# Patient Record
Sex: Female | Born: 1965 | Race: Black or African American | Hispanic: No | Marital: Married | Smoking: Never smoker
Health system: Southern US, Community
[De-identification: ages and names within clinical notes are randomized; demographics above are authoritative.]

## PROBLEM LIST (undated history)

## (undated) DIAGNOSIS — E559 Vitamin D deficiency, unspecified: Secondary | ICD-10-CM

## (undated) HISTORY — PX: OTHER SURGICAL HISTORY: SHX169

---
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
## 2013-01-14 ENCOUNTER — Encounter (HOSPITAL_COMMUNITY): Payer: Self-pay | Admitting: Family Medicine

## 2013-01-14 ENCOUNTER — Emergency Department (HOSPITAL_COMMUNITY): Payer: No Typology Code available for payment source

## 2013-01-14 ENCOUNTER — Emergency Department (HOSPITAL_COMMUNITY)
Admission: EM | Admit: 2013-01-14 | Discharge: 2013-01-14 | Disposition: A | Discharge status: Home / Self Care | Payer: No Typology Code available for payment source | Attending: Family Medicine | Admitting: Family Medicine

## 2013-01-14 DIAGNOSIS — R531 Weakness: Secondary | ICD-10-CM

## 2013-01-14 DIAGNOSIS — R202 Paresthesia of skin: Secondary | ICD-10-CM

## 2013-01-14 DIAGNOSIS — R209 Unspecified disturbances of skin sensation: Secondary | ICD-10-CM | POA: Insufficient documentation

## 2013-01-14 DIAGNOSIS — M6281 Muscle weakness (generalized): Secondary | ICD-10-CM | POA: Insufficient documentation

## 2013-01-14 DIAGNOSIS — R51 Headache: Secondary | ICD-10-CM | POA: Insufficient documentation

## 2013-01-14 DIAGNOSIS — R29898 Other symptoms and signs involving the musculoskeletal system: Secondary | ICD-10-CM

## 2013-01-14 LAB — URINALYSIS, ROUTINE W REFLEX MICROSCOPIC
Bilirubin Urine: NEGATIVE
Glucose, UA: NEGATIVE mg/dL
Protein, ur: NEGATIVE mg/dL
Specific Gravity, Urine: 1.004 — ABNORMAL LOW (ref 1.005–1.030)
Urobilinogen, UA: 0.2 mg/dL (ref 0.0–1.0)

## 2013-01-14 LAB — POCT I-STAT, CHEM 8
Calcium, Ion: 1.17 mmol/L (ref 1.12–1.23)
Chloride: 108 mEq/L (ref 96–112)
Glucose, Bld: 100 mg/dL — ABNORMAL HIGH (ref 70–99)
HCT: 37 % (ref 36.0–46.0)
Hemoglobin: 12.6 g/dL (ref 12.0–15.0)
Sodium: 139 mEq/L (ref 135–145)
TCO2: 20 mmol/L (ref 0–100)

## 2013-01-14 LAB — CBC
MCH: 26.2 pg (ref 26.0–34.0)
Platelets: 316 10*3/uL (ref 150–400)
RDW: 14.6 % (ref 11.5–15.5)
WBC: 10.9 10*3/uL — ABNORMAL HIGH (ref 4.0–10.5)

## 2013-01-14 LAB — COMPREHENSIVE METABOLIC PANEL
ALT: 17 U/L (ref 0–35)
AST: 22 U/L (ref 0–37)
Albumin: 3.9 g/dL (ref 3.5–5.2)
CO2: 20 mEq/L (ref 19–32)
Calcium: 9.4 mg/dL (ref 8.4–10.5)
Chloride: 103 mEq/L (ref 96–112)
GFR calc Af Amer: >90 mL/min (ref >90)
GFR calc non Af Amer: >90 mL/min (ref >90)
Sodium: 136 mEq/L (ref 135–145)
Total Bilirubin: 0.3 mg/dL (ref 0.3–1.2)

## 2013-01-14 LAB — DIFFERENTIAL
Basophils Absolute: 0 10*3/uL (ref 0.0–0.1)
Basophils Relative: 0 % (ref 0–1)
Lymphocytes Relative: 16 % (ref 12–46)
Neutro Abs: 8.2 10*3/uL — ABNORMAL HIGH (ref 1.7–7.7)

## 2013-01-14 LAB — POCT I-STAT TROPONIN I: Troponin i, poc: 0 ng/mL (ref 0.00–0.08)

## 2013-01-14 LAB — RAPID URINE DRUG SCREEN, HOSP PERFORMED
Amphetamines: NOT DETECTED
Barbiturates: NOT DETECTED
Cocaine: NOT DETECTED
Tetrahydrocannabinol: NOT DETECTED

## 2013-01-14 LAB — URINE MICROSCOPIC-ADD ON

## 2013-01-14 LAB — PROTIME-INR
INR: 1.09 (ref 0.00–1.49)
Prothrombin Time: 13.9 seconds (ref 11.6–15.2)

## 2013-01-14 LAB — ETHANOL: Alcohol, Ethyl (B): <11 mg/dL (ref 0–11)

## 2013-01-14 LAB — APTT: aPTT: 34 seconds (ref 24–37)

## 2013-01-14 LAB — GLUCOSE, CAPILLARY: Glucose-Capillary: 98 mg/dL (ref 70–99)

## 2013-01-14 MED ORDER — ASPIRIN EC 81 MG PO TBEC: 81.0000 mg | DELAYED_RELEASE_TABLET | Freq: Every day | ORAL | Status: DC

## 2013-01-14 MED ORDER — IOHEXOL 350 MG/ML SOLN
50.0000 mL | Freq: Once | INTRAVENOUS | Status: AC | PRN
  Administered 2013-01-14: 50 mL via INTRAVENOUS

## 2013-01-14 NOTE — ED Provider Notes (Signed)
I saw and evaluated the patient, reviewed the resident's note and I agree with the findings and plan.  Pt is a 47 y.o. with no significant past medical history who reports that at 70 AM she became very upset and then again having right-sided facial numbness and weakness. She reports she is also having a mild right-sided headache. She's never had similar symptoms in the past. No history of head injury. No fever, neck pain or neck stiffness. Patient reports that her symptoms are early and slightly improving. On exam, cranial nerves II through XII intact, no pronator drift in her upper extremities, patient is able to angulate without difficulty, moves all extremities equally, sensation to light touch slightly diminished in her right upper extremity compared to left.  Code stroke initiated.  Neurology has seen patient. Head CT shows no acute abnormality.  Given her symptoms are improving, she is not a TPA candidate.  CHADS2 score is 0.   Date: 01/14/2013 13:05; 13:34  Rate: 90, 88  Rhythm: normal sinus rhythm  QRS Axis: normal  Intervals: normal  ST/T Wave abnormalities: normal  Conduction Disutrbances: none  Narrative Interpretation: unremarkable; poor R-wave progression, no ischemic changes  3:48 PM  Neurology would like CT head. If negative, patient will need an outpatient workup.    [name], DO 01/14/13 [phone]

## 2013-01-14 NOTE — ED Provider Notes (Signed)
CSN: [id number]     Arrival date & time 01/14/13  1257 History   First MD Initiated Contact with Patient 01/14/13 1314     Chief Complaint  Patient presents with  . Weakness  . Numbness   HPI Pt is a 47 y/o female with no know PMHx who presents to the ED today with R sided numbness in her UE and minimal RLE numbness after argument and anger with her sister.  Pt states that she was in her normal state of health up until this AM when around 11, she and her sister got into a heated argument and she started to have some RUE numbness, but no weakness.  This slowly progressed into her RLE and completely resolved by the time she arrived at the ED.  She denies any type of chest pains at the time of event, shortness of breath, N/V/D, blurred vision, diplopia, diaphoresis, terminal doom feeling, fever, chills, or sweats.  Does a/w R sided HA, that was sharp and remitted by time of admission.  States she has never had this in the past.  Denies any tobacco, alcohol, or illicit substance use, and has not done anything out of the ordinary in the past two-three days.   History reviewed. No pertinent past medical history. History reviewed. No pertinent past surgical history. Family History  Problem Relation Age of Onset  . Diabetes Mother    History  Substance Use Topics  . Smoking status: Never Smoker   . Smokeless tobacco: Not on file  . Alcohol Use: No   OB History   Grav Para Term Preterm Abortions TAB SAB Ect Mult Living                 Review of Systems  Constitutional: Negative for fever, chills, diaphoresis, appetite change and fatigue.  HENT: Negative for sore throat, trouble swallowing and neck pain.   Eyes: Negative for photophobia and visual disturbance.  Respiratory: Negative for cough, chest tightness, shortness of breath and wheezing.   Cardiovascular: Negative for chest pain, palpitations and leg swelling.  Gastrointestinal: Negative for nausea, vomiting, abdominal pain and diarrhea.   Endocrine: Negative.   Musculoskeletal: Negative.   Neurological: Positive for weakness, numbness and headaches. Negative for dizziness and speech difficulty.  Hematological: Negative.   Psychiatric/Behavioral: Negative.   All other systems reviewed and are negative.    Allergies  Review of patient's allergies indicates no known allergies.  Home Medications  No current outpatient prescriptions on file. BP 125/68  Pulse 93  Temp(Src) 99.1 F (37.3 C) (Oral)  Resp 13  SpO2 100%  LMP 12/24/2012 Physical Exam  Constitutional: She appears well-developed and well-nourished. No distress.  HENT:  Head: Normocephalic and atraumatic.  Eyes: Conjunctivae and EOM are normal.  Neck: Normal range of motion. Neck supple.  Cardiovascular: Normal rate and regular rhythm.   No murmur heard. Pulmonary/Chest: Effort normal and breath sounds normal.  Abdominal: Soft. Bowel sounds are normal.  Musculoskeletal: Normal range of motion.  Neurological: She has normal reflexes.  AAO x 3, CN 2-12 intact, MS 5/5 UE/LE B/L, sensation intact B/L, finger to nose/pronator drift/heel to shin intact B/L  Skin: She is not diaphoretic.  Psychiatric: She has a normal mood and affect. Her speech is normal.    ED Course  Procedures (including critical care time)  Date: 01/14/2013  Rate: 80  Rhythm: normal sinus rhythm  QRS Axis: normal  Intervals: normal  ST/T Wave abnormalities: normal  Conduction Disutrbances: none  Narrative Interpretation:  NSR, normal EKG  Old EKG Reviewed: No previous EKG on file  Labs Review Labs Reviewed  CBC - Abnormal; Notable for the following:    WBC 10.9 (*)    Hemoglobin 11.5 (*)    HCT 35.7 (*)    All other components within normal limits  DIFFERENTIAL - Abnormal; Notable for the following:    Neutro Abs 8.2 (*)    All other components within normal limits  POCT I-STAT, CHEM 8 - Abnormal; Notable for the following:    Potassium 3.4 (*)    Glucose, Bld 100 (*)     All other components within normal limits  PROTIME-INR  APTT  GLUCOSE, CAPILLARY  ETHANOL  COMPREHENSIVE METABOLIC PANEL  TROPONIN I  URINE RAPID DRUG SCREEN (HOSP PERFORMED)  URINALYSIS, ROUTINE W REFLEX MICROSCOPIC  POCT I-STAT TROPONIN I   Imaging Review Ct Head Wo Contrast  01/14/2013   CLINICAL DATA:  Right arm tingling, right leg weakness  EXAM: CT HEAD WITHOUT CONTRAST  TECHNIQUE: Contiguous axial images were obtained from the base of the skull through the vertex without intravenous contrast.  COMPARISON:  None.  FINDINGS: The ventricular system is normal in size and configuration, and the septum is in a normal midline position. The 4th ventricle and basilar cisterns appear normal. No hemorrhage, mass lesion, or acute infarction is seen. On bone window images, no calvarial abnormality is seen. The paranasal sinuses are pneumatized.  IMPRESSION: Negative unenhanced CT of the brain.  This report was called to Dr. Thad Ranger at 1:29 p.m. on 01/14/2013   Electronically Signed   By: Dwyane Dee M.D.   On: 01/14/2013 13:41    MDM   1. Right sided weakness   Pt H&P consistent with TIA vs conversion disorder.  Code Stroke called upon arrival and evaluation by neurology greatly appreciated.  CT head negative for acute process, will get a plethora of labs to r/o possible organic etiologies.  As well, will get CTA to evaluate for possible vascular process.  Will re-examine after this.  However, pt Sx are remitting at this point, and her MS on examination is 5/5 B/L UE/LE and no CN deficits.  As this was specifically brought on by increased stress and her motor/sensory exams are completely normal at this point, conversion disorder is highly likely.  Will defer disposition to neurology for inpt vs outpt further w/u.   3:28 PM - Pt Sx have completely resolved, lab work and imaging to this point are negative.  Currently in radiology receiving a CTA, and pending report, neurology will assess for  disposition.  Pt signed out to Dr. Beverely Pace, resident coming on.   Twana First Paulina Fusi, DO of [facility] 01/14/2013, 3:29 PM    Briscoe Deutscher, DO 01/14/13 1529

## 2013-01-14 NOTE — ED Provider Notes (Signed)
Assumption of care note:  Patient care assumed from Dr. Paulina Fusi at 3:30 PM. Please refer to his note for further details of the visit. Briefly, 47 year old female here with acute onset of right arm/leg weakness and tingling that began immediately after an argument with her sister. Her symptoms have since resolved. Code stroke was called. Neurology evaluated the patient. CTA and neurology disposition pending at transferred care.  Results for orders placed during the hospital encounter of 01/14/13  ETHANOL      Result Value Range   Alcohol, Ethyl (B) <11  0 - 11 mg/dL  PROTIME-INR      Result Value Range   Prothrombin Time 13.9  11.6 - 15.2 seconds   INR 1.09  0.00 - 1.49  APTT      Result Value Range   aPTT 34  24 - 37 seconds  CBC      Result Value Range   WBC 10.9 (*) 4.0 - 10.5 K/uL   RBC 4.39  3.87 - 5.11 MIL/uL   Hemoglobin 11.5 (*) 12.0 - 15.0 g/dL   HCT 16.1 (*) 09.6 - 04.5 %   MCV 81.3  78.0 - 100.0 fL   MCH 26.2  26.0 - 34.0 pg   MCHC 32.2  30.0 - 36.0 g/dL   RDW 40.9  81.1 - 91.4 %   Platelets 316  150 - 400 K/uL  DIFFERENTIAL      Result Value Range   Neutrophils Relative % 75  43 - 77 %   Neutro Abs 8.2 (*) 1.7 - 7.7 K/uL   Lymphocytes Relative 16  12 - 46 %   Lymphs Abs 1.8  0.7 - 4.0 K/uL   Monocytes Relative 8  3 - 12 %   Monocytes Absolute 0.8  0.1 - 1.0 K/uL   Eosinophils Relative 1  0 - 5 %   Eosinophils Absolute 0.2  0.0 - 0.7 K/uL   Basophils Relative 0  0 - 1 %   Basophils Absolute 0.0  0.0 - 0.1 K/uL  COMPREHENSIVE METABOLIC PANEL      Result Value Range   Sodium 136  135 - 145 mEq/L   Potassium 3.5  3.5 - 5.1 mEq/L   Chloride 103  96 - 112 mEq/L   CO2 20  19 - 32 mEq/L   Glucose, Bld 99  70 - 99 mg/dL   BUN 9  6 - 23 mg/dL   Creatinine, Ser 7.82 (*) 0.50 - 1.10 mg/dL   Calcium 9.4  8.4 - 95.6 mg/dL   Total Protein 7.7  6.0 - 8.3 g/dL   Albumin 3.9  3.5 - 5.2 g/dL   AST 22  0 - 37 U/L   ALT 17  0 - 35 U/L   Alkaline Phosphatase 74  39 - 117 U/L    Total Bilirubin 0.3  0.3 - 1.2 mg/dL   GFR calc non Af Amer >90  >90 mL/min   GFR calc Af Amer >90  >90 mL/min  TROPONIN I      Result Value Range   Troponin I <0.30  <0.30 ng/mL  URINE RAPID DRUG SCREEN (HOSP PERFORMED)      Result Value Range   Opiates NONE DETECTED  NONE DETECTED   Cocaine NONE DETECTED  NONE DETECTED   Benzodiazepines NONE DETECTED  NONE DETECTED   Amphetamines NONE DETECTED  NONE DETECTED   Tetrahydrocannabinol NONE DETECTED  NONE DETECTED   Barbiturates NONE DETECTED  NONE DETECTED  URINALYSIS, ROUTINE W  REFLEX MICROSCOPIC      Result Value Range   Color, Urine YELLOW  YELLOW   APPearance CLEAR  CLEAR   Specific Gravity, Urine 1.004 (*) 1.005 - 1.030   pH 6.5  5.0 - 8.0   Glucose, UA NEGATIVE  NEGATIVE mg/dL   Hgb urine dipstick NEGATIVE  NEGATIVE   Bilirubin Urine NEGATIVE  NEGATIVE   Ketones, ur NEGATIVE  NEGATIVE mg/dL   Protein, ur NEGATIVE  NEGATIVE mg/dL   Urobilinogen, UA 0.2  0.0 - 1.0 mg/dL   Nitrite NEGATIVE  NEGATIVE   Leukocytes, UA MODERATE (*) NEGATIVE  GLUCOSE, CAPILLARY      Result Value Range   Glucose-Capillary 98  70 - 99 mg/dL  URINE MICROSCOPIC-ADD ON      Result Value Range   Squamous Epithelial / LPF FEW (*) RARE   WBC, UA 3-6  <3 WBC/hpf   RBC / HPF 0-2  <3 RBC/hpf   Bacteria, UA RARE  RARE  POCT I-STAT, CHEM 8      Result Value Range   Sodium 139  135 - 145 mEq/L   Potassium 3.4 (*) 3.5 - 5.1 mEq/L   Chloride 108  96 - 112 mEq/L   BUN 8  6 - 23 mg/dL   Creatinine, Ser 1.61  0.50 - 1.10 mg/dL   Glucose, Bld 096 (*) 70 - 99 mg/dL   Calcium, Ion 0.45  4.09 - 1.23 mmol/L   TCO2 20  0 - 100 mmol/L   Hemoglobin 12.6  12.0 - 15.0 g/dL   HCT 81.1  91.4 - 78.2 %  POCT I-STAT TROPONIN I      Result Value Range   Troponin i, poc 0.00  0.00 - 0.08 ng/mL   Comment 3            Ct Angio Head W/cm &/or Wo Cm  01/14/2013   CLINICAL DATA:  Code stroke. Right-sided weakness and numbness. Headache.  EXAM: CT ANGIOGRAPHY HEAD AND NECK   TECHNIQUE: Multidetector CT imaging of the head and neck was performed using the standard protocol during bolus administration of intravenous contrast. Multiplanar CT image reconstructions including MIPs were obtained to evaluate the vascular anatomy. Carotid stenosis measurements (when applicable) are obtained utilizing NASCET criteria, using the distal internal carotid diameter as the denominator.  CONTRAST:  50mL OMNIPAQUE IOHEXOL 350 MG/ML SOLN  COMPARISON:  None.  FINDINGS: CTA HEAD FINDINGS  Post contrast imaging of the head demonstrates no pathologic enhancement. Did the ventricles are of normal size. No significant extra-axial fluid collection is present. The source images demonstrate normal gray-white differentiation. Note is made of a prominent Chiari malformation. The cerebellar tonsils extend 2.3 mm below the foramen magnum. This is more easily seen on the sagittal reformatted images than on the axial source images. The upper cervical spine is unremarkable.  The internal carotid arteries are within normal limits bilaterally. The A1 and M1 segments are within normal limits. The anterior communicating artery is patent. The ACA and MCA branch vessels are unremarkable.  The left vertebral artery is slightly dominant to the right. The basilar artery is within normal limits. The left posterior cerebral artery originates from the basilar tip. The right posterior cerebral artery receives contribution from both the basilar tip and a prominent posterior communicating artery. The PCA branch vessels are within normal limits.  Review of the MIP images confirms the above findings.  CTA NECK FINDINGS  A standard 3 vessel arch configuration is present. The vertebral arteries both  originate from the subclavian arteries. The left vertebral artery is slightly dominant to the right. The vertebral arteries are within normal limits throughout their course in the neck.  The right common carotid artery is within normal limits.  Bifurcation is unremarkable. The right cervical ICA is normal.  The left common carotid artery is within normal limits. The bifurcation is unremarkable. The left internal carotid artery is normal.  Soft tissues of the neck are within normal limits. The cervical spine is unremarkable.  Review of the MIP images confirms the above findings.  IMPRESSION: CTA HEAD IMPRESSION  1. 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 2. Normal CTA of the brain without evidence for significant proximal stenosis, aneurysm, or branch vessel occlusion.  CTA NECK IMPRESSION  1. Negative CTA of the neck.   Electronically Signed   By: Gennette Pac   On: 01/14/2013 18:31   Ct Head Wo Contrast  01/14/2013   CLINICAL DATA:  Right arm tingling, right leg weakness  EXAM: CT HEAD WITHOUT CONTRAST  TECHNIQUE: Contiguous axial images were obtained from the base of the skull through the vertex without intravenous contrast.  COMPARISON:  None.  FINDINGS: The ventricular system is normal in size and configuration, and the septum is in a normal midline position. The 4th ventricle and basilar cisterns appear normal. No hemorrhage, mass lesion, or acute infarction is seen. On bone window images, no calvarial abnormality is seen. The paranasal sinuses are pneumatized.  IMPRESSION: Negative unenhanced CT of the brain.  This report was called to Dr. Thad Ranger at 1:29 p.m. on 01/14/2013   Electronically Signed   By: Dwyane Dee M.D.   On: 01/14/2013 13:41   Ct Angio Neck W/cm &/or Wo/cm  01/14/2013   CLINICAL DATA:  Code stroke. Right-sided weakness and numbness. Headache.  EXAM: CT ANGIOGRAPHY HEAD AND NECK  TECHNIQUE: Multidetector CT imaging of the head and neck was performed using the standard protocol during bolus administration of intravenous contrast. Multiplanar CT image reconstructions including MIPs were obtained to evaluate the vascular anatomy. Carotid stenosis measurements  (when applicable) are obtained utilizing NASCET criteria, using the distal internal carotid diameter as the denominator.  CONTRAST:  50mL OMNIPAQUE IOHEXOL 350 MG/ML SOLN  COMPARISON:  None.  FINDINGS: CTA HEAD FINDINGS  Post contrast imaging of the head demonstrates no pathologic enhancement. Did the ventricles are of normal size. No significant extra-axial fluid collection is present. The source images demonstrate normal gray-white differentiation. Note is made of a prominent Chiari malformation. The cerebellar tonsils extend 2.3 mm below the foramen magnum. This is more easily seen on the sagittal reformatted images than on the axial source images. The upper cervical spine is unremarkable.  The internal carotid arteries are within normal limits bilaterally. The A1 and M1 segments are within normal limits. The anterior communicating artery is patent. The ACA and MCA branch vessels are unremarkable.  The left vertebral artery is slightly dominant to the right. The basilar artery is within normal limits. The left posterior cerebral artery originates from the basilar tip. The right posterior cerebral artery receives contribution from both the basilar tip and a prominent posterior communicating artery. The PCA branch vessels are within normal limits.  Review of the MIP images confirms the above findings.  CTA NECK FINDINGS  A standard 3 vessel arch configuration is present. The vertebral arteries both originate from the subclavian arteries. The left vertebral artery is slightly dominant to the right. The  vertebral arteries are within normal limits throughout their course in the neck.  The right common carotid artery is within normal limits. Bifurcation is unremarkable. The right cervical ICA is normal.  The left common carotid artery is within normal limits. The bifurcation is unremarkable. The left internal carotid artery is normal.  Soft tissues of the neck are within normal limits. The cervical spine is  unremarkable.  Review of the MIP images confirms the above findings.  IMPRESSION: CTA HEAD IMPRESSION  1. 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 2. Normal CTA of the brain without evidence for significant proximal stenosis, aneurysm, or branch vessel occlusion.  CTA NECK IMPRESSION  1. Negative CTA of the neck.   Electronically Signed   By: Gennette Pac   On: 01/14/2013 18:31    7:01 PM CT imaging only remarkable for Chiari 1 malformation. Repeat gross neurologic exam demonstrated 5/5 strength in her bilateral upper and lower extremities. Finger to nose intact. Gait steady. Gross sensation to light touch intact in all extremities. CNs intact. Neurology recommended outpatient followup. She was given a prescription for 81 mg of aspirin to be taken daily. Return precautions were reviewed.   Clinical Impression: 1. Right sided weakness   2. Right arm weakness   3. Right leg weakness   4. Tingling in extremities     Disposition: Discharge  Condition: Good  I have discussed the results, Dx and Tx plan. They expressed understanding and agree with the plan and were told to return to ED with any worsening of condition or concern.    New Prescriptions   ASPIRIN EC 81 MG TABLET    Take 1 tablet (81 mg total) by mouth daily.    Follow Up: [facility]     [address] [place] [address]   Followup with your doctor as discussed. You may also followup at the [facility].  [facility] [address] [place] [address] [phone] Call    Pt seen in conjunction with Dr. Elesa Massed.  Reine Just. Beverely Pace, MD Emergency Medicine PGY-III [phone]    Oleh Genin, MD 01/14/13 [phone]

## 2013-01-14 NOTE — Consult Note (Signed)
Referring Physician: Ward    Chief Complaint: Code stroke  HPI:                                                                                                                                         Linda Joseph is an 47 y.o. female who was at home cooking when she noted right arm and leg tingling sensation and weakness in the right arm and leg.  Patient was brought to [facility] and due to symptoms Code stroke was called. At time of neurology consultation patients symptoms of weakness had resolved and right arm had some tingling.  Within 20 minutes all of her symptoms had fully resolved.  No tPA was administered due to resolving symptoms.  Currently she is back to her baseline.   Date last known well: Date: 01/14/2013 Time last known well: Time: 11:00 tPA Given: No: resolving symptoms Initial NIHSS:2  Within 20 minutes NIHSS-0  History reviewed. No pertinent past medical history.  History reviewed. No pertinent past surgical history.  Family History  Problem Relation Age of Onset  . Diabetes Mother    Social History:  reports that she has never smoked. She does not have any smokeless tobacco history on file. She reports that she does not drink alcohol. Her drug history is not on file.  Allergies: No Known Allergies  Medications:                                                                                                                           No current facility-administered medications for this encounter.   No current outpatient prescriptions on file.     ROS:  History obtained from the patient  General ROS: negative for - chills, fatigue, fever, night sweats, weight gain or weight loss Psychological ROS: negative for - behavioral disorder, hallucinations, memory difficulties, mood swings or suicidal ideation Ophthalmic ROS:  negative for - blurry vision, double vision, eye pain or loss of vision ENT ROS: negative for - epistaxis, nasal discharge, oral lesions, sore throat, tinnitus or vertigo Allergy and Immunology ROS: negative for - hives or itchy/watery eyes Hematological and Lymphatic ROS: negative for - bleeding problems, bruising or swollen lymph nodes Endocrine ROS: negative for - galactorrhea, hair pattern changes, polydipsia/polyuria or temperature intolerance Respiratory ROS: negative for - cough, hemoptysis, shortness of breath or wheezing Cardiovascular ROS: negative for - chest pain, dyspnea on exertion, edema or irregular heartbeat Gastrointestinal ROS: negative for - abdominal pain, diarrhea, hematemesis, nausea/vomiting or stool incontinence Genito-Urinary ROS: negative for - dysuria, hematuria, incontinence or urinary frequency/urgency Musculoskeletal ROS: negative for - joint swelling or muscular weakness Neurological ROS: as noted in HPI Dermatological ROS: negative for rash and skin lesion changes  Neurologic Examination:                                                                                                      Blood pressure 125/68, pulse 93, temperature 99.1 F (37.3 C), temperature source Oral, resp. rate 13, last menstrual period 12/24/2012, SpO2 100.00%.  Mental Status: Alert, oriented to person place year but initially did not get her age correct--later in exam she did get this correct. Thought content appropriate.  Speech fluent without evidence of aphasia.  Able to follow 3 step commands without difficulty. Cranial Nerves: II: Discs flat bilaterally; Visual fields grossly normal, pupils equal, round, reactive to light and accommodation III,IV, VI: ptosis not present, extra-ocular motions intact bilaterally V,VII: smile symmetric, facial light touch sensation normal bilaterally VIII: hearing normal bilaterally IX,X: gag reflex present XI: bilateral shoulder shrug XII:  midline tongue extension Motor: Right : Upper extremity   5/5    Left:     Upper extremity   5/5  Lower extremity   5/5     Lower extremity   5/5 Tone and bulk:normal tone throughout; no atrophy noted Sensory: Pinprick and light touch initially decreased on the right arm but resolved fully within 20 minutes Deep Tendon Reflexes:  Right: Upper Extremity   Left: Upper extremity   biceps (C-5 to C-6) 2/4   biceps (C-5 to C-6) 2/4 tricep (C7) 2/4    triceps (C7) 2/4 Brachioradialis (C6) 2/4  Brachioradialis (C6) 2/4  Lower Extremity Lower Extremity  quadriceps (L-2 to L-4) 2/4   quadriceps (L-2 to L-4) 2/4 Achilles (S1) 2/4   Achilles (S1) 2/4  Plantars: Right: downgoing   Left: downgoing Cerebellar: normal finger-to-nose,  normal heel-to-shin test Gait: not assessed while in ED CV: pulses palpable throughout    Results for orders placed during the hospital encounter of 01/14/13 (from the past 48 hour(s))  GLUCOSE, CAPILLARY     Status: None   Collection Time    01/14/13  1:28 PM      Result  Value Range   Glucose-Capillary 98  70 - 99 mg/dL  CBC     Status: Abnormal   Collection Time    01/14/13  1:33 PM      Result Value Range   WBC 10.9 (*) 4.0 - 10.5 K/uL   RBC 4.39  3.87 - 5.11 MIL/uL   Hemoglobin 11.5 (*) 12.0 - 15.0 g/dL   HCT 16.1 (*) 09.6 - 04.5 %   MCV 81.3  78.0 - 100.0 fL   MCH 26.2  26.0 - 34.0 pg   MCHC 32.2  30.0 - 36.0 g/dL   RDW 40.9  81.1 - 91.4 %   Platelets 316  150 - 400 K/uL  DIFFERENTIAL     Status: Abnormal   Collection Time    01/14/13  1:33 PM      Result Value Range   Neutrophils Relative % 75  43 - 77 %   Neutro Abs 8.2 (*) 1.7 - 7.7 K/uL   Lymphocytes Relative 16  12 - 46 %   Lymphs Abs 1.8  0.7 - 4.0 K/uL   Monocytes Relative 8  3 - 12 %   Monocytes Absolute 0.8  0.1 - 1.0 K/uL   Eosinophils Relative 1  0 - 5 %   Eosinophils Absolute 0.2  0.0 - 0.7 K/uL   Basophils Relative 0  0 - 1 %   Basophils Absolute 0.0  0.0 - 0.1 K/uL  POCT  I-STAT TROPONIN I     Status: None   Collection Time    01/14/13  1:43 PM      Result Value Range   Troponin i, poc 0.00  0.00 - 0.08 ng/mL   Comment 3            Comment: Due to the release kinetics of cTnI,     a negative result within the first hours     of the onset of symptoms does not rule out     myocardial infarction with certainty.     If myocardial infarction is still suspected,     repeat the test at appropriate intervals.  POCT I-STAT, CHEM 8     Status: Abnormal   Collection Time    01/14/13  1:45 PM      Result Value Range   Sodium 139  135 - 145 mEq/L   Potassium 3.4 (*) 3.5 - 5.1 mEq/L   Chloride 108  96 - 112 mEq/L   BUN 8  6 - 23 mg/dL   Creatinine, Ser 7.82  0.50 - 1.10 mg/dL   Glucose, Bld 956 (*) 70 - 99 mg/dL   Calcium, Ion 2.13  0.86 - 1.23 mmol/L   TCO2 20  0 - 100 mmol/L   Hemoglobin 12.6  12.0 - 15.0 g/dL   HCT 57.8  46.9 - 62.9 %   Ct Head Wo Contrast  01/14/2013   CLINICAL DATA:  Right arm tingling, right leg weakness  EXAM: CT HEAD WITHOUT CONTRAST  TECHNIQUE: Contiguous axial images were obtained from the base of the skull through the vertex without intravenous contrast.  COMPARISON:  None.  FINDINGS: The ventricular system is normal in size and configuration, and the septum is in a normal midline position. The 4th ventricle and basilar cisterns appear normal. No hemorrhage, mass lesion, or acute infarction is seen. On bone window images, no calvarial abnormality is seen. The paranasal sinuses are pneumatized.  IMPRESSION: Negative unenhanced CT of the brain.  This report was called to  Dr. Thad Ranger at 1:29 p.m. on 01/14/2013   Electronically Signed   By: Dwyane Dee M.D.   On: 01/14/2013 13:41    Felicie Morn PA-C [facility] [phone]  01/14/2013, 2:07 PM  Patient seen and examined.  Clinical course and management discussed.  Necessary edits performed.  I agree with the above.  Assessment and plan of care developed and discussed below.     Assessment: 47 y.o. female presenting with complaint of right sided weakness and right arm tingling.  Symptoms resolved in the ED.  CT of the head was reviewed and showed no acute changes.  Patient without a pertinent medical history.  On no medications at home.  Symptoms did occur while patient was upset and may be related to anxiety.  Further work up recommended.       Stroke Risk Factors - none  Recommendations: 1.  CTA of the head and neck 2.  If above CT unremarkable.  Would discharge patient to home as long as her examination remains nonfocal.  3.  ASA 81mg  daily 4.  Follow up with PCP as an outpatient  Case discussed with Dr. Altamease Oiler, MD [facility] [phone]  01/14/2013  3:30 PM

## 2013-01-14 NOTE — Code Documentation (Signed)
47 yo female presenting to [facility] with c/o right leg weakness and right arm tingling.  Code Stroke called at 1317.  NIHSS 2 on arrival for missed age and right arm decreased sensation.  See code documentation in doc flowsheets.  CT completed and reviewed by Dr. Thad Ranger. Symptoms are improving per patient.  No acute stroke intervention at this time.  Patient is in the window for treatment with tPA if symptoms should worsen until 1530.  Handoff completed with ED RN at bedside regarding frequent vital signs and neuro checks.  Patient updated on plan of care at bedside.  Research RN made aware that patient is a potential candidate for the POINT trial, Research RN to evaluate.

## 2013-01-14 NOTE — ED Notes (Signed)
Family at bedside. 

## 2013-01-14 NOTE — ED Notes (Signed)
Pt brought back from CT scanner. Stroke team and neuro at the bedside.

## 2013-01-14 NOTE — ED Notes (Signed)
Per pt sts she was very upset PTA and her right side became weak, numb and face was numb. sts also a slight HA.

## 2013-01-15 LAB — URINE CULTURE
Colony Count: NO GROWTH
Culture: NO GROWTH

## 2013-01-19 NOTE — ED Provider Notes (Signed)
I saw and evaluated the patient, reviewed the resident's note and I agree with the findings and plan.   Layla Maw Curtis Cain, DO 01/19/13 [phone]

## 2017-07-01 ENCOUNTER — Ambulatory Visit (HOSPITAL_COMMUNITY)
Admission: RE | Admit: 2017-07-01 | Discharge: 2017-07-01 | Disposition: A | Discharge status: Home / Self Care | Payer: BLUE CROSS/BLUE SHIELD | Source: Ambulatory Visit | Attending: Family Medicine | Admitting: Family Medicine

## 2017-07-01 ENCOUNTER — Ambulatory Visit (INDEPENDENT_AMBULATORY_CARE_PROVIDER_SITE_OTHER): Payer: BLUE CROSS/BLUE SHIELD | Admitting: Family Medicine

## 2017-07-01 ENCOUNTER — Encounter: Payer: Self-pay | Admitting: Family Medicine

## 2017-07-01 ENCOUNTER — Telehealth: Payer: Self-pay | Admitting: Family Medicine

## 2017-07-01 VITALS — BP 128/80 | HR 94 | Temp 97.6°F | Resp 16 | Ht 61.0 in | Wt 142.0 lb

## 2017-07-01 DIAGNOSIS — Z130 Encounter for screening for diseases of the blood and blood-forming organs and certain disorders involving the immune mechanism: Secondary | ICD-10-CM

## 2017-07-01 DIAGNOSIS — Z9289 Personal history of other medical treatment: Secondary | ICD-10-CM | POA: Diagnosis not present

## 2017-07-01 DIAGNOSIS — Z1329 Encounter for screening for other suspected endocrine disorder: Secondary | ICD-10-CM | POA: Diagnosis not present

## 2017-07-01 DIAGNOSIS — Z131 Encounter for screening for diabetes mellitus: Secondary | ICD-10-CM

## 2017-07-01 DIAGNOSIS — Z7689 Persons encountering health services in other specified circumstances: Secondary | ICD-10-CM | POA: Diagnosis not present

## 2017-07-01 LAB — POCT URINALYSIS DIP (DEVICE)
BILIRUBIN URINE: NEGATIVE
Glucose, UA: NEGATIVE mg/dL
Ketones, ur: NEGATIVE mg/dL
LEUKOCYTES UA: NEGATIVE
Nitrite: NEGATIVE
Protein, ur: NEGATIVE mg/dL
Specific Gravity, Urine: 1.02 (ref 1.005–1.030)
Urobilinogen, UA: 0.2 mg/dL (ref 0.0–1.0)
pH: 7.5 (ref 5.0–8.0)

## 2017-07-01 LAB — POCT GLYCOSYLATED HEMOGLOBIN (HGB A1C): HEMOGLOBIN A1C: 5.6

## 2017-07-01 NOTE — Progress Notes (Signed)
Patient ID: Linda Joseph, female    DOB: 08/23/1965, 52 y.o.   MRN: [id number]  PCP: Bing NeighborsHarris, Ryeleigh Santore S, FNP  Chief Complaint  Patient presents with  . Establish Care    Subjective:  HPI Linda Joseph is a 52 y.o. female with limited medical history presents to establish care. 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 Reports that she lives a very active lifestyle. Denies any chronic medical conditions. No recent primary care. Denies any know history of TB or exposure.  She has no other complaints today. Social History   Socioeconomic History  . Marital status: Married    Spouse name: Not on file  . Number of children: Not on file  . Years of education: Not on file  . Highest education level: Not on file  Social Needs  . Financial resource strain: Not on file  . Food insecurity - worry: Not on file  . Food insecurity - inability: Not on file  . Transportation needs - medical: Not on file  . Transportation needs - non-medical: Not on file  Occupational History  . Not on file  Tobacco Use  . Smoking status: Never Smoker  . Smokeless tobacco: Never Used  Substance and Sexual Activity  . Alcohol use: No  . Drug use: No  . Sexual activity: Not on file  Other Topics Concern  . Not on file  Social History Narrative  . Not on file    Family History  Problem Relation Age of Onset  . Diabetes Mother   . Diabetes Father   . Hypertension Father      Review of Systems  HENT: Negative.   Eyes:       Last eye exam 6 months Corrective lenses wearer   Respiratory: Negative.   Cardiovascular: Negative.   Gastrointestinal: Negative.   Endocrine: Negative.   Genitourinary: Negative.   Musculoskeletal: Positive for arthralgias and back pain.  Neurological: Negative.   Hematological: Negative.   Psychiatric/Behavioral: Negative.  Negative for suicidal ideas.   No Known  Allergies  Prior to Admission medications   Medication Sig Start Date End Date Taking? Authorizing Provider  aspirin EC 81 MG tablet Take 1 tablet (81 mg total) by mouth daily. Patient not taking: Reported on 07/01/2017 01/14/13   Oleh GeninBryant, Casey, MD    Past Medical, Surgical Family and Social History reviewed and updated.    Objective:   Today's Vitals   07/01/17 1310  BP: 128/80  Pulse: 94  Resp: 16  Temp: 97.6 F (36.4 C)  TempSrc: Oral  SpO2: 100%  Weight: 142 lb (64.4 kg)  Height: 5\' 1"  (1.549 m)    Wt Readings from Last 3 Encounters:  07/01/17 142 lb (64.4 kg)    Physical Exam Constitutional: Patient appears well-developed and well-nourished. No distress. HENT: Normocephalic, atraumatic, External right and left ear normal. Oropharynx is clear and moist.  Eyes: Conjunctivae and EOM are normal. PERRLA, no scleral icterus. Neck: Normal ROM. Neck supple. No JVD. No tracheal deviation. No thyromegaly. CVS: RRR, S1/S2 +, no murmurs, no gallops, no carotid bruit.  Pulmonary: Effort and breath sounds normal, no stridor, rhonchi, wheezes, rales.  Abdominal: Soft. BS +, no distension, tenderness, rebound or guarding.  Musculoskeletal: Normal range of motion. No edema and no tenderness.  Lymphadenopathy: No lymphadenopathy noted, cervical. Neuro: Alert. Normal reflexes, muscle tone coordination.  Skin: Skin is warm and  dry. No rash noted. Not diaphoretic. No erythema. No pallor. Psychiatric: Normal mood and affect. Behavior, judgment, thought content normal.   Assessment & Plan:  1. Encounter to establish care  2. Screening for diabetes mellitus, A1C 5.6, normal. Repeat in 12 months.  3. History of TB skin testing,  QuantiFERON-TB Gold Plus and DG Chest 2 View. Upon receipt of CXR results will provide a letter verifying status of TB. Will also check a Quantiferon Gold  4. Screening for deficiency anemia,  CBC with Differential  5. 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 EXAM: CHEST - 2 VIEW COMPARISON:  None. FINDINGS: The cardiac and mediastinal silhouettes are within normal limits. No adenopathy. The lungs are normally inflated. No airspace consolidation, pleural effusion, or pulmonary edema is identified. No imaging findings to suggests active or latent TB identified. No pneumothorax. No acute osseous abnormality identified. IMPRESSION: 1. No radiographic evidence for active or latent granulomatous disease identified. 2. No other active cardiopulmonary disease. Electronically Signed   By: Rise Mu M.D.   On: 07/01/2017 14:15    RTC: as needed for complete physical exam     Godfrey Pick. Tiburcio Pea, MSN, FNP-C [facility]  [address] [place], [place] [address] [phone]

## 2017-07-01 NOTE — Telephone Encounter (Signed)
Faxed letter to patient's employer regarding normal chest x-ray  [phone]

## 2017-07-01 NOTE — Progress Notes (Deleted)
PPOC

## 2017-07-01 NOTE — Telephone Encounter (Signed)
Fax letter once chest x-ray is received to [phone]  Godfrey PickKimberly S. Tiburcio PeaHarris, MSN, FNP-C [facility]  [address] [place], [place] [address] [phone]

## 2017-07-01 NOTE — Patient Instructions (Signed)
Exercising to Stay Healthy Exercising regularly is important. It has many health benefits, such as:  Improving your overall fitness, flexibility, and endurance.  Increasing your bone density.  Helping with weight control.  Decreasing your body fat.  Increasing your muscle strength.  Reducing stress and tension.  Improving your overall health.  In order to become healthy and stay healthy, it is recommended that you do moderate-intensity and vigorous-intensity exercise. You can tell that you are exercising at a moderate intensity if you have a higher heart rate and faster breathing, but you are still able to hold a conversation. You can tell that you are exercising at a vigorous intensity if you are breathing much harder and faster and cannot hold a conversation while exercising. How often should I exercise? Choose an activity that you enjoy and set realistic goals. Your health care provider can help you to make an activity plan that works for you. Exercise regularly as directed by your health care provider. This may include:  Doing resistance training twice each week, such as: ? Push-ups. ? Sit-ups. ? Lifting weights. ? Using resistance bands.  Doing a given intensity of exercise for a given amount of time. Choose from these options: ? 150 minutes of moderate-intensity exercise every week. ? 75 minutes of vigorous-intensity exercise every week. ? A mix of moderate-intensity and vigorous-intensity exercise every week.  Children, pregnant women, people who are out of shape, people who are overweight, and older adults may need to consult a health care provider for individual recommendations. If you have any sort of medical condition, be sure to consult your health care provider before starting a new exercise program. What are some exercise ideas? Some moderate-intensity exercise ideas include:  Walking at a rate of 1 mile in 15  minutes.  Biking.  Hiking.  Golfing.  Dancing.  Some vigorous-intensity exercise ideas include:  Walking at a rate of at least 4.5 miles per hour.  Jogging or running at a rate of 5 miles per hour.  Biking at a rate of at least 10 miles per hour.  Lap swimming.  Roller-skating or in-line skating.  Cross-country skiing.  Vigorous competitive sports, such as football, basketball, and soccer.  Jumping rope.  Aerobic dancing.  What are some everyday activities that can help me to get exercise?  Yard work, such as: ? Pushing a lawn mower. ? Raking and bagging leaves.  Washing and waxing your car.  Pushing a stroller.  Shoveling snow.  Gardening.  Washing windows or floors. How can I be more active in my day-to-day activities?  Use the stairs instead of the elevator.  Take a walk during your lunch break.  If you drive, park your car farther away from work or school.  If you take public transportation, get off one stop early and walk the rest of the way.  Make all of your phone calls while standing up and walking around.  Get up, stretch, and walk around every 30 minutes throughout the day. What guidelines should I follow while exercising?  Do not exercise so much that you hurt yourself, feel dizzy, or get very short of breath.  Consult your health care provider before starting a new exercise program.  Wear comfortable clothes and shoes with good support.  Drink plenty of water while you exercise to prevent dehydration or heat stroke. Body water is lost during exercise and must be replaced.  Work out until you breathe faster and your heart beats faster. This information is not   intended to replace advice given to you by your health care provider. Make sure you discuss any questions you have with your health care provider. Document Released: 05/12/2010 Document Revised: 09/15/2015 Document Reviewed: 09/10/2013 Elsevier Interactive Patient Education  2018  [employer]  

## 2017-07-02 ENCOUNTER — Encounter: Payer: Self-pay | Admitting: Family Medicine

## 2017-07-02 NOTE — Progress Notes (Signed)
Mail lab letter  

## 2017-07-04 NOTE — Telephone Encounter (Signed)
Letter faxed.

## 2017-07-06 LAB — CBC WITH DIFFERENTIAL/PLATELET
Basophils Absolute: 0 10*3/uL (ref 0.0–0.2)
Basos: 0 %
EOS (ABSOLUTE): 0.2 10*3/uL (ref 0.0–0.4)
Eos: 3 %
Hematocrit: 36.8 % (ref 34.0–46.6)
Hemoglobin: 12.4 g/dL (ref 11.1–15.9)
Immature Grans (Abs): 0 10*3/uL (ref 0.0–0.1)
Immature Granulocytes: 0 %
LYMPHS ABS: 1.7 10*3/uL (ref 0.7–3.1)
Lymphs: 30 %
MCH: 28.2 pg (ref 26.6–33.0)
MCHC: 33.7 g/dL (ref 31.5–35.7)
MCV: 84 fL (ref 79–97)
MONOS ABS: 0.5 10*3/uL (ref 0.1–0.9)
Monocytes: 9 %
NEUTROS ABS: 3.3 10*3/uL (ref 1.4–7.0)
Neutrophils: 58 %
PLATELETS: 301 10*3/uL (ref 150–379)
RBC: 4.4 x10E6/uL (ref 3.77–5.28)
RDW: 14.6 % (ref 12.3–15.4)
WBC: 5.7 10*3/uL (ref 3.4–10.8)

## 2017-07-06 LAB — QUANTIFERON-TB GOLD PLUS
QUANTIFERON NIL VALUE: 0.09 [IU]/mL
QuantiFERON Mitogen Value: >10 IU/mL
QuantiFERON TB1 Ag Value: 0.14 IU/mL
QuantiFERON TB2 Ag Value: 0.14 IU/mL
QuantiFERON-TB Gold Plus: NEGATIVE

## 2017-07-06 LAB — COMPREHENSIVE METABOLIC PANEL
A/G RATIO: 1.8 (ref 1.2–2.2)
ALT: 22 IU/L (ref 0–32)
AST: 21 IU/L (ref 0–40)
Albumin: 4.6 g/dL (ref 3.5–5.5)
Alkaline Phosphatase: 92 IU/L (ref 39–117)
BILIRUBIN TOTAL: 0.4 mg/dL (ref 0.0–1.2)
BUN/Creatinine Ratio: 4 — ABNORMAL LOW (ref 9–23)
BUN: 2 mg/dL — ABNORMAL LOW (ref 6–24)
CHLORIDE: 105 mmol/L (ref 96–106)
CO2: 22 mmol/L (ref 20–29)
Calcium: 9.3 mg/dL (ref 8.7–10.2)
Creatinine, Ser: 0.48 mg/dL — ABNORMAL LOW (ref 0.57–1.00)
GFR calc non Af Amer: 116 mL/min/{1.73_m2} (ref >59)
GFR, EST AFRICAN AMERICAN: 133 mL/min/{1.73_m2} (ref >59)
Globulin, Total: 2.6 g/dL (ref 1.5–4.5)
Glucose: 86 mg/dL (ref 65–99)
POTASSIUM: 4.3 mmol/L (ref 3.5–5.2)
Sodium: 140 mmol/L (ref 134–144)
Total Protein: 7.2 g/dL (ref 6.0–8.5)

## 2017-07-06 LAB — THYROID PANEL WITH TSH
Free Thyroxine Index: 2 (ref 1.2–4.9)
T3 Uptake Ratio: 23 % — ABNORMAL LOW (ref 24–39)
T4, Total: 8.7 ug/dL (ref 4.5–12.0)
TSH: 1.31 u[IU]/mL (ref 0.450–4.500)

## 2019-01-22 DIAGNOSIS — E559 Vitamin D deficiency, unspecified: Secondary | ICD-10-CM

## 2019-01-22 HISTORY — DX: Vitamin D deficiency, unspecified: E55.9

## 2019-02-04 ENCOUNTER — Ambulatory Visit (INDEPENDENT_AMBULATORY_CARE_PROVIDER_SITE_OTHER): Payer: Self-pay | Admitting: Family Medicine

## 2019-02-04 ENCOUNTER — Encounter: Payer: Self-pay | Admitting: Family Medicine

## 2019-02-04 ENCOUNTER — Other Ambulatory Visit: Payer: Self-pay

## 2019-02-04 VITALS — BP 119/74 | HR 79 | Temp 98.2°F | Ht 61.0 in | Wt 148.0 lb

## 2019-02-04 DIAGNOSIS — Z0000 Encounter for general adult medical examination without abnormal findings: Secondary | ICD-10-CM | POA: Diagnosis not present

## 2019-02-04 DIAGNOSIS — Z09 Encounter for follow-up examination after completed treatment for conditions other than malignant neoplasm: Secondary | ICD-10-CM | POA: Diagnosis not present

## 2019-02-04 DIAGNOSIS — R8290 Unspecified abnormal findings in urine: Secondary | ICD-10-CM

## 2019-02-04 LAB — GLUCOSE, POCT (MANUAL RESULT ENTRY): POC Glucose: 108 mg/dl — AB (ref 70–99)

## 2019-02-04 LAB — POCT URINALYSIS DIPSTICK
Bilirubin, UA: NEGATIVE
Blood, UA: NEGATIVE
Glucose, UA: NEGATIVE
Ketones, UA: NEGATIVE
Nitrite, UA: NEGATIVE
Protein, UA: NEGATIVE
Spec Grav, UA: 1.01 (ref 1.010–1.025)
Urobilinogen, UA: 0.2 E.U./dL
pH, UA: 6 (ref 5.0–8.0)

## 2019-02-04 LAB — POCT GLYCOSYLATED HEMOGLOBIN (HGB A1C): Hemoglobin A1C: 5.4 % (ref 4.0–5.6)

## 2019-02-04 NOTE — Progress Notes (Signed)
[facility]   Annual Physical   Subjective:  Patient ID: Linda Joseph, female    DOB: 09/02/1965  Age: 53 y.o. MRN: [id number]  CC:  Chief Complaint  Patient presents with  . Annual Exam  . Flu Vaccine    HPI Linda Joseph is a 53 year old female who presents for her Annual Physical today.   History reviewed. No pertinent past medical history.   Current Status: Since her last office visit, she is doing well with no complaints. She denies fevers, chills, fatigue, recent infections, weight loss, and night sweats. She has not had any headaches, visual changes, dizziness, and falls. No chest pain, heart palpitations, cough and shortness of breath reported. No reports of GI problems such as nausea, vomiting, diarrhea, and constipation. She has no reports of blood in stools, dysuria and hematuria. No depression or anxiety reported. She denies pain today.   Past Surgical History:  Procedure Laterality Date  . childbirth     x2    Family History  Problem Relation Age of Onset  . Diabetes Mother   . Diabetes Father   . Hypertension Father     Social History   Socioeconomic History  . Marital status: Married    Spouse name: Not on file  . Number of children: Not on file  . Years of education: Not on file  . Highest education level: Not on file  Occupational History  . Not on file  Social Needs  . Financial resource strain: Not on file  . Food insecurity    Worry: Not on file    Inability: Not on file  . Transportation needs    Medical: Not on file    Non-medical: Not on file  Tobacco Use  . Smoking status: Never Smoker  . Smokeless tobacco: Never Used  Substance and Sexual Activity  . Alcohol use: No  . Drug use: No  . Sexual activity: Not on file  Lifestyle  . Physical activity    Days per week: Not on file    Minutes per session: Not on file  . Stress: Not on file  Relationships  . Social Musicianconnections    Talks  on phone: Not on file    Gets together: Not on file    Attends religious service: Not on file    Active member of club or organization: Not on file    Attends meetings of clubs or organizations: Not on file    Relationship status: Not on file  . Intimate partner violence    Fear of current or ex partner: Not on file    Emotionally abused: Not on file    Physically abused: Not on file    Forced sexual activity: Not on file  Other Topics Concern  . Not on file  Social History Narrative  . Not on file    Outpatient Medications Prior to Visit  Medication Sig Dispense Refill  . aspirin EC 81 MG tablet Take 1 tablet (81 mg total) by mouth daily. (Patient not taking: Reported on 07/01/2017) 30 tablet 12   No facility-administered medications prior to visit.     No Known Allergies  ROS Review of Systems  Constitutional: Negative.   HENT: Negative.   Eyes: Negative.   Respiratory: Negative.   Cardiovascular: Negative.   Gastrointestinal: Negative.   Endocrine: Negative.   Genitourinary: Negative.   Musculoskeletal: Negative.   Skin: Negative.   Allergic/Immunologic: Negative.  Neurological: Negative.   Hematological: Negative.   Psychiatric/Behavioral: Negative.       Objective:    Physical Exam  Constitutional: She is oriented to person, place, and time. She appears well-developed and well-nourished.  HENT:  Head: Normocephalic and atraumatic.  Eyes: Conjunctivae are normal.  Neck: Normal range of motion. Neck supple.  Cardiovascular: Normal rate, regular rhythm, normal heart sounds and intact distal pulses.  Pulmonary/Chest: Effort normal and breath sounds normal.  Abdominal: Soft. Bowel sounds are normal.  Musculoskeletal: Normal range of motion.  Neurological: She is alert and oriented to person, place, and time.  Skin: Skin is warm.  Psychiatric: She has a normal mood and affect. Her behavior is normal. Judgment and thought content normal.  Nursing note and  vitals reviewed.   BP 119/74   Pulse 79   Temp 98.2 F (36.8 C) (Oral)   Ht 5\' 1"  (1.549 m)   Wt 148 lb (67.1 kg)   LMP 12/24/2012   SpO2 98%   BMI 27.96 kg/m  Wt Readings from Last 3 Encounters:  02/04/19 148 lb (67.1 kg)  07/01/17 142 lb (64.4 kg)     Health Maintenance Due  Topic Date Due  . HIV Screening  11/29/1982  . TETANUS/TDAP  11/29/1986  . PAP SMEAR-Modifier  11/28/1988  . MAMMOGRAM  11/28/2017  . COLONOSCOPY  11/28/2017  . INFLUENZA VACCINE  11/22/2018    There are no preventive care reminders to display for this patient.  Lab Results  Component Value Date   TSH 1.310 07/01/2017   Lab Results  Component Value Date   WBC 5.7 07/01/2017   HGB 12.4 07/01/2017   HCT 36.8 07/01/2017   MCV 84 07/01/2017   PLT 301 07/01/2017   Lab Results  Component Value Date   NA 140 07/01/2017   K 4.3 07/01/2017   CO2 22 07/01/2017   GLUCOSE 86 07/01/2017   BUN 2 (L) 07/01/2017   CREATININE 0.48 (L) 07/01/2017   BILITOT 0.4 07/01/2017   ALKPHOS 92 07/01/2017   AST 21 07/01/2017   ALT 22 07/01/2017   PROT 7.2 07/01/2017   ALBUMIN 4.6 07/01/2017   CALCIUM 9.3 07/01/2017   No results found for: CHOL No results found for: HDL No results found for: LDLCALC No results found for: TRIG No results found for: [facility] Lab Results  Component Value Date   HGBA1C 5.4 02/04/2019      Assessment & Plan:   1. Encounter for annual physical exam No abnormalities noted upon physical examination.   2. Abnormal urine Results are pending.  - Urine Culture  3. Healthcare maintenance - POCT glucose (manual entry) - POCT urinalysis dipstick - POCT glycosylated hemoglobin (Hb A1C) - CBC with Differential; Future - Comprehensive metabolic panel; Future - TSH; Future - Lipid Panel; Future - Vitamin B12; Future - Vitamin D, 25-hydroxy; Future  4. Follow up He will follow up in 6 months.   No orders of the defined types were placed in this encounter.   Orders  Placed This Encounter  Procedures  . Urine Culture  . CBC with Differential  . Comprehensive metabolic panel  . TSH  . Lipid Panel  . Vitamin B12  . Vitamin D, 25-hydroxy  . POCT glucose (manual entry)  . POCT urinalysis dipstick  . POCT glycosylated hemoglobin (Hb A1C)    Referral Orders  No referral(s) requested today    Kathe Becton,  MSN, FNP-BC [facility]  [address] [place], [place] [address] [phone] fax    Problem List Items Addressed This Visit    None    Visit Diagnoses    Encounter for annual physical exam    -  Primary   Abnormal urine       Relevant Orders   Urine Culture   Healthcare maintenance       Relevant Orders   POCT glucose (manual entry) (Completed)   POCT urinalysis dipstick (Completed)   POCT glycosylated hemoglobin (Hb A1C) (Completed)   CBC with Differential   Comprehensive metabolic panel   TSH   Lipid Panel   Vitamin B12   Vitamin D, 25-hydroxy   Follow up          No orders of the defined types were placed in this encounter.   Follow-up: Return in about 6 months (around 08/05/2019).    Kallie Locks, FNP

## 2019-02-06 ENCOUNTER — Other Ambulatory Visit: Payer: Self-pay

## 2019-02-06 DIAGNOSIS — Z0000 Encounter for general adult medical examination without abnormal findings: Secondary | ICD-10-CM

## 2019-02-07 LAB — TSH: TSH: 0.95 u[IU]/mL (ref 0.450–4.500)

## 2019-02-07 LAB — CBC WITH DIFFERENTIAL/PLATELET
Basophils Absolute: 0 10*3/uL (ref 0.0–0.2)
Basos: 0 %
EOS (ABSOLUTE): 0.2 10*3/uL (ref 0.0–0.4)
Eos: 2 %
Hematocrit: 35.6 % (ref 34.0–46.6)
Hemoglobin: 11.9 g/dL (ref 11.1–15.9)
Immature Grans (Abs): 0 10*3/uL (ref 0.0–0.1)
Immature Granulocytes: 0 %
Lymphocytes Absolute: 2.2 10*3/uL (ref 0.7–3.1)
Lymphs: 29 %
MCH: 27.5 pg (ref 26.6–33.0)
MCHC: 33.4 g/dL (ref 31.5–35.7)
MCV: 82 fL (ref 79–97)
Monocytes Absolute: 0.7 10*3/uL (ref 0.1–0.9)
Monocytes: 9 %
Neutrophils Absolute: 4.5 10*3/uL (ref 1.4–7.0)
Neutrophils: 60 %
Platelets: 275 10*3/uL (ref 150–450)
RBC: 4.33 x10E6/uL (ref 3.77–5.28)
RDW: 13 % (ref 11.7–15.4)
WBC: 7.5 10*3/uL (ref 3.4–10.8)

## 2019-02-07 LAB — COMPREHENSIVE METABOLIC PANEL
ALT: 16 IU/L (ref 0–32)
AST: 22 IU/L (ref 0–40)
Albumin/Globulin Ratio: 1.5 (ref 1.2–2.2)
Albumin: 4.4 g/dL (ref 3.8–4.9)
Alkaline Phosphatase: 91 IU/L (ref 39–117)
BUN/Creatinine Ratio: 22 (ref 9–23)
BUN: 13 mg/dL (ref 6–24)
Bilirubin Total: 0.4 mg/dL (ref 0.0–1.2)
CO2: 21 mmol/L (ref 20–29)
Calcium: 9.5 mg/dL (ref 8.7–10.2)
Chloride: 104 mmol/L (ref 96–106)
Creatinine, Ser: 0.6 mg/dL (ref 0.57–1.00)
GFR calc Af Amer: 122 mL/min/{1.73_m2} (ref >59)
GFR calc non Af Amer: 106 mL/min/{1.73_m2} (ref >59)
Globulin, Total: 3 g/dL (ref 1.5–4.5)
Glucose: 91 mg/dL (ref 65–99)
Potassium: 3.8 mmol/L (ref 3.5–5.2)
Sodium: 138 mmol/L (ref 134–144)
Total Protein: 7.4 g/dL (ref 6.0–8.5)

## 2019-02-07 LAB — URINE CULTURE: Organism ID, Bacteria: NO GROWTH

## 2019-02-07 LAB — LIPID PANEL
Chol/HDL Ratio: 3.1 ratio (ref 0.0–4.4)
Cholesterol, Total: 170 mg/dL (ref 100–199)
HDL: 54 mg/dL (ref >39)
LDL Chol Calc (NIH): 96 mg/dL (ref 0–99)
Triglycerides: 109 mg/dL (ref 0–149)
VLDL Cholesterol Cal: 20 mg/dL (ref 5–40)

## 2019-02-07 LAB — VITAMIN B12: Vitamin B-12: 333 pg/mL (ref 232–1245)

## 2019-02-07 LAB — VITAMIN D 25 HYDROXY (VIT D DEFICIENCY, FRACTURES): Vit D, 25-Hydroxy: 18.5 ng/mL — ABNORMAL LOW (ref 30.0–100.0)

## 2019-02-09 ENCOUNTER — Encounter: Payer: Self-pay | Admitting: Family Medicine

## 2019-02-09 ENCOUNTER — Other Ambulatory Visit: Payer: Self-pay | Admitting: Family Medicine

## 2019-02-09 DIAGNOSIS — E559 Vitamin D deficiency, unspecified: Secondary | ICD-10-CM

## 2019-02-09 MED ORDER — VITAMIN D (ERGOCALCIFEROL) 1.25 MG (50000 UNIT) PO CAPS: 50000.0000 [IU] | ORAL_CAPSULE | ORAL | 6 refills | Status: DC

## 2019-08-05 ENCOUNTER — Encounter: Payer: Self-pay | Admitting: Family Medicine

## 2019-08-05 ENCOUNTER — Other Ambulatory Visit: Payer: Self-pay

## 2019-08-05 ENCOUNTER — Ambulatory Visit (INDEPENDENT_AMBULATORY_CARE_PROVIDER_SITE_OTHER): Payer: Self-pay | Admitting: Family Medicine

## 2019-08-05 VITALS — BP 116/64 | HR 81 | Temp 98.2°F | Ht 61.0 in | Wt 144.8 lb

## 2019-08-05 DIAGNOSIS — E559 Vitamin D deficiency, unspecified: Secondary | ICD-10-CM

## 2019-08-05 DIAGNOSIS — Z0000 Encounter for general adult medical examination without abnormal findings: Secondary | ICD-10-CM

## 2019-08-05 DIAGNOSIS — Z1231 Encounter for screening mammogram for malignant neoplasm of breast: Secondary | ICD-10-CM

## 2019-08-05 DIAGNOSIS — Z09 Encounter for follow-up examination after completed treatment for conditions other than malignant neoplasm: Secondary | ICD-10-CM

## 2019-08-05 LAB — POCT URINALYSIS DIPSTICK
Bilirubin, UA: NEGATIVE
Blood, UA: NEGATIVE
Glucose, UA: NEGATIVE
Ketones, UA: NEGATIVE
Nitrite, UA: NEGATIVE
Protein, UA: NEGATIVE
Spec Grav, UA: 1.015 (ref 1.010–1.025)
Urobilinogen, UA: 0.2 E.U./dL
pH, UA: 6 (ref 5.0–8.0)

## 2019-08-05 LAB — POCT GLYCOSYLATED HEMOGLOBIN (HGB A1C): Hemoglobin A1C: 5.6 % (ref 4.0–5.6)

## 2019-08-05 LAB — GLUCOSE, POCT (MANUAL RESULT ENTRY): POC Glucose: 106 mg/dl — AB (ref 70–99)

## 2019-08-05 NOTE — Progress Notes (Signed)
[facility]   Established Patient Office Visit  Subjective:  Patient ID: Linda Joseph, female    DOB: August 20, 1965  Age: 54 y.o. MRN: [id number]  CC:  Chief Complaint  Patient presents with  . Follow-up    6 mth follow up    HPI Linda Joseph is a 54 year old female is a 2 year presents for Follow Up today.   Past Medical History:  Diagnosis Date  . Vitamin D deficiency 01/2019   Current Status: Since her last office visit, she is doing well with no complaints. She denies fevers, chills, fatigue, recent infections, weight loss, and night sweats. She has not had any headaches, visual changes, dizziness, and falls. No chest pain, heart palpitations, cough and shortness of breath reported. Denies GI problems such as nausea, vomiting, diarrhea, and constipation. She has no reports of blood in stools, dysuria and hematuria. No depression or anxiety, and denies suicidal ideations, homicidal ideations, or auditory hallucinations. She is taking all medications as prescribed. She denies pain today.   Past Surgical History:  Procedure Laterality Date  . childbirth     x2    Family History  Problem Relation Age of Onset  . Diabetes Mother   . Diabetes Father   . Hypertension Father     Social History   Socioeconomic History  . Marital status: Married    Spouse name: Not on file  . Number of children: Not on file  . Years of education: Not on file  . Highest education level: Not on file  Occupational History  . Not on file  Tobacco Use  . Smoking status: Never Smoker  . Smokeless tobacco: Never Used  Substance and Sexual Activity  . Alcohol use: No  . Drug use: No  . Sexual activity: Yes    Partners: Male  Other Topics Concern  . Not on file  Social History Narrative  . Not on file   Social Determinants of Health   Financial Resource Strain:   . Difficulty of Paying Living Expenses:   Food Insecurity:   . Worried  About Programme researcher, broadcasting/film/video in the Last Year:   . Barista in the Last Year:   Transportation Needs:   . Freight forwarder (Medical):   Marland Kitchen Lack of Transportation (Non-Medical):   Physical Activity:   . Days of Exercise per Week:   . Minutes of Exercise per Session:   Stress:   . Feeling of Stress :   Social Connections:   . Frequency of Communication with Friends and Family:   . Frequency of Social Gatherings with Friends and Family:   . Attends Religious Services:   . Active Member of Clubs or Organizations:   . Attends Banker Meetings:   Marland Kitchen Marital Status:   Intimate Partner Violence:   . Fear of Current or Ex-Partner:   . Emotionally Abused:   Marland Kitchen Physically Abused:   . Sexually Abused:     Outpatient Medications Prior to Visit  Medication Sig Dispense Refill  . aspirin EC 81 MG tablet Take 1 tablet (81 mg total) by mouth daily. 30 tablet 12  . Vitamin D, Ergocalciferol, (DRISDOL) 1.25 MG (50000 UT) CAPS capsule Take 1 capsule (50,000 Units total) by mouth every 7 (seven) days. 5 capsule 6   No facility-administered medications prior to visit.    No Known Allergies  ROS Review of Systems  Constitutional: Negative.  HENT: Negative.   Eyes: Negative.   Respiratory: Negative.   Cardiovascular: Negative.   Gastrointestinal: Negative.   Endocrine: Negative.   Genitourinary: Negative.   Musculoskeletal: Negative.   Skin: Negative.   Allergic/Immunologic: Negative.   Neurological: Negative.   Hematological: Negative.   Psychiatric/Behavioral: Negative.       Objective:    Physical Exam  Constitutional: She is oriented to person, place, and time. She appears well-developed and well-nourished.  HENT:  Head: Normocephalic and atraumatic.  Eyes: Conjunctivae are normal.  Cardiovascular: Normal rate, regular rhythm, normal heart sounds and intact distal pulses.  Pulmonary/Chest: Effort normal and breath sounds normal.  Abdominal: Soft. Bowel  sounds are normal.  Musculoskeletal:        General: Normal range of motion.     Cervical back: Normal range of motion and neck supple.  Neurological: She is alert and oriented to person, place, and time. She has normal reflexes.  Skin: Skin is warm and dry.  Psychiatric: She has a normal mood and affect. Her behavior is normal. Judgment and thought content normal.  Nursing note and vitals reviewed.   BP 116/64   Pulse 81   Temp 98.2 F (36.8 C) (Oral)   Ht 5\' 1"  (1.549 m)   Wt 144 lb 12.8 oz (65.7 kg)   LMP 12/24/2012   SpO2 100%   BMI 27.36 kg/m  Wt Readings from Last 3 Encounters:  08/05/19 144 lb 12.8 oz (65.7 kg)  02/04/19 148 lb (67.1 kg)  07/01/17 142 lb (64.4 kg)     Health Maintenance Due  Topic Date Due  . HIV Screening  Never done  . TETANUS/TDAP  Never done  . PAP SMEAR-Modifier  Never done  . MAMMOGRAM  Never done  . COLONOSCOPY  Never done    There are no preventive care reminders to display for this patient.  Lab Results  Component Value Date   TSH 0.950 02/06/2019   Lab Results  Component Value Date   WBC 7.5 02/06/2019   HGB 11.9 02/06/2019   HCT 35.6 02/06/2019   MCV 82 02/06/2019   PLT 275 02/06/2019   Lab Results  Component Value Date   NA 138 02/06/2019   K 3.8 02/06/2019   CO2 21 02/06/2019   GLUCOSE 91 02/06/2019   BUN 13 02/06/2019   CREATININE 0.60 02/06/2019   BILITOT 0.4 02/06/2019   ALKPHOS 91 02/06/2019   AST 22 02/06/2019   ALT 16 02/06/2019   PROT 7.4 02/06/2019   ALBUMIN 4.4 02/06/2019   CALCIUM 9.5 02/06/2019   Lab Results  Component Value Date   CHOL 170 02/06/2019   Lab Results  Component Value Date   HDL 54 02/06/2019   Lab Results  Component Value Date   LDLCALC 96 02/06/2019   Lab Results  Component Value Date   TRIG 109 02/06/2019   Lab Results  Component Value Date   CHOLHDL 3.1 02/06/2019   Lab Results  Component Value Date   HGBA1C 5.6 08/05/2019      Assessment & Plan:   1. Vitamin  D deficiency - Vitamin D, 25-hydroxy  2. Encounter for screening mammogram for malignant neoplasm of breast - MM DIGITAL SCREENING BILATERAL; Future  3. Healthcare maintenance - POCT glycosylated hemoglobin (Hb A1C) - POCT urinalysis dipstick - POCT glucose (manual entry)  4. Follow up She will follow up as needed.   No orders of the defined types were placed in this encounter.   Orders Placed This  Encounter  Procedures  . MM DIGITAL SCREENING BILATERAL  . Vitamin D, 25-hydroxy  . POCT glycosylated hemoglobin (Hb A1C)  . POCT urinalysis dipstick  . POCT glucose (manual entry)    Referral Orders  No referral(s) requested today    Raliegh Ip,  MSN, FNP-BC [facility] [address] [place], [place] [address] [phone]- fax   Problem List Items Addressed This Visit    None    Visit Diagnoses    Vitamin D deficiency    -  Primary   Relevant Orders   Vitamin D, 25-hydroxy (Completed)   Encounter for screening mammogram for malignant neoplasm of breast       Relevant Orders   MM DIGITAL SCREENING BILATERAL   Healthcare maintenance       Relevant Orders   POCT glycosylated hemoglobin (Hb A1C) (Completed)   POCT urinalysis dipstick (Completed)   POCT glucose (manual entry) (Completed)   Follow up          No orders of the defined types were placed in this encounter.   Follow-up: No follow-ups on file.    Kallie Locks, FNP

## 2019-08-06 DIAGNOSIS — E559 Vitamin D deficiency, unspecified: Secondary | ICD-10-CM | POA: Insufficient documentation

## 2019-08-06 LAB — VITAMIN D 25 HYDROXY (VIT D DEFICIENCY, FRACTURES): Vit D, 25-Hydroxy: 42.8 ng/mL (ref 30.0–100.0)

## 2019-08-27 ENCOUNTER — Other Ambulatory Visit: Payer: Self-pay

## 2019-08-27 ENCOUNTER — Ambulatory Visit
Admission: RE | Admit: 2019-08-27 | Discharge: 2019-08-27 | Disposition: A | Discharge status: Home / Self Care | Payer: Medicaid Other | Source: Ambulatory Visit | Attending: Family Medicine | Admitting: Family Medicine

## 2019-08-27 DIAGNOSIS — Z1231 Encounter for screening mammogram for malignant neoplasm of breast: Secondary | ICD-10-CM

## 2019-08-31 ENCOUNTER — Other Ambulatory Visit: Payer: Self-pay | Admitting: Family Medicine

## 2019-08-31 ENCOUNTER — Telehealth: Payer: Self-pay | Admitting: Family Medicine

## 2019-08-31 DIAGNOSIS — R928 Other abnormal and inconclusive findings on diagnostic imaging of breast: Secondary | ICD-10-CM

## 2019-08-31 NOTE — Telephone Encounter (Signed)
Attempted to contact patient today to review recent Mammogram results. Message left with husband for patient to return call to our office.

## 2019-10-17 ENCOUNTER — Other Ambulatory Visit: Payer: Self-pay | Admitting: Family Medicine

## 2019-10-17 DIAGNOSIS — E559 Vitamin D deficiency, unspecified: Secondary | ICD-10-CM

## 2020-03-02 ENCOUNTER — Telehealth: Payer: Self-pay | Admitting: Family Medicine

## 2020-03-02 NOTE — Telephone Encounter (Signed)
Pt was called concerning AWV w/ PCC. Lvm to call back  °

## 2020-03-25 ENCOUNTER — Encounter: Payer: Self-pay | Admitting: Family Medicine

## 2020-03-25 ENCOUNTER — Ambulatory Visit (INDEPENDENT_AMBULATORY_CARE_PROVIDER_SITE_OTHER): Payer: Self-pay | Admitting: Family Medicine

## 2020-03-25 ENCOUNTER — Other Ambulatory Visit: Payer: Self-pay

## 2020-03-25 VITALS — BP 136/76 | HR 90 | Temp 98.9°F | Ht 59.06 in | Wt 145.8 lb

## 2020-03-25 DIAGNOSIS — Z0000 Encounter for general adult medical examination without abnormal findings: Secondary | ICD-10-CM

## 2020-03-25 DIAGNOSIS — Z09 Encounter for follow-up examination after completed treatment for conditions other than malignant neoplasm: Secondary | ICD-10-CM

## 2020-03-25 DIAGNOSIS — E559 Vitamin D deficiency, unspecified: Secondary | ICD-10-CM

## 2020-03-25 NOTE — Progress Notes (Signed)
32mo f/u -No issues

## 2020-03-25 NOTE — Progress Notes (Signed)
[facility]  Annual Physical   Subjective:  Patient ID: Linda Joseph, female    DOB: [birth year]  Age: 54 y.o. MRN: [id number]  CC:  Chief Complaint  Patient presents with  . Follow-up    HPI Linda Joseph is a 54 year old female who presents for Annual Physical today.    Patient Active Problem List   Diagnosis Date Noted  . Vitamin D deficiency 08/06/2019    Current Status: Since her last office visit, she is doing well with no complaints. She previously had Mammogram and recommended to have Diagnostic for further evaluation. Patient declines this procedure for now. She denies fevers, chills, fatigue, recent infections, weight loss, and night sweats. She has not had any headaches, visual changes, dizziness, and falls. No chest pain, heart palpitations, cough and shortness of breath reported. Denies GI problems such as nausea, vomiting, diarrhea, and constipation. She has no reports of blood in stools, dysuria and hematuria. No depression or anxiety reported today.  She is taking all medications as prescribed. She denies pain today.   Past Medical History:  Diagnosis Date  . Vitamin D deficiency 01/2019    Past Surgical History:  Procedure Laterality Date  . childbirth     x2    Family History  Problem Relation Age of Onset  . Diabetes Mother   . Diabetes Father   . Hypertension Father     Social History   Socioeconomic History  . Marital status: Married    Spouse name: Not on file  . Number of children: Not on file  . Years of education: Not on file  . Highest education level: Not on file  Occupational History  . Not on file  Tobacco Use  . Smoking status: Never Smoker  . Smokeless tobacco: Never Used  Vaping Use  . Vaping Use: Never used  Substance and Sexual Activity  . Alcohol use: No  . Drug use: No  . Sexual activity: Yes    Partners: Male  Other Topics Concern  . Not on file  Social History Narrative   . Not on file   Social Determinants of Health   Financial Resource Strain:   . Difficulty of Paying Living Expenses: Not on file  Food Insecurity:   . Worried About Programme researcher, broadcasting/film/video in the Last Year: Not on file  . Ran Out of Food in the Last Year: Not on file  Transportation Needs:   . Lack of Transportation (Medical): Not on file  . Lack of Transportation (Non-Medical): Not on file  Physical Activity:   . Days of Exercise per Week: Not on file  . Minutes of Exercise per Session: Not on file  Stress:   . Feeling of Stress : Not on file  Social Connections:   . Frequency of Communication with Friends and Family: Not on file  . Frequency of Social Gatherings with Friends and Family: Not on file  . Attends Religious Services: Not on file  . Active Member of Clubs or Organizations: Not on file  . Attends Banker Meetings: Not on file  . Marital Status: Not on file  Intimate Partner Violence:   . Fear of Current or Ex-Partner: Not on file  . Emotionally Abused: Not on file  . Physically Abused: Not on file  . Sexually Abused: Not on file    Outpatient Medications Prior to Visit  Medication Sig Dispense Refill  . Vitamin D, Ergocalciferol, (DRISDOL)  1.25 MG (50000 UNIT) CAPS capsule TAKE 1 CAPSULE BY MOUTH EVERY 7 DAYS 5 capsule 6  . aspirin EC 81 MG tablet Take 1 tablet (81 mg total) by mouth daily. (Patient not taking: Reported on 03/25/2020) 30 tablet 12   No facility-administered medications prior to visit.    No Known Allergies  ROS Review of Systems  Constitutional: Negative.   HENT: Negative.   Eyes: Negative.   Respiratory: Negative.   Cardiovascular: Negative.   Gastrointestinal: Negative.   Endocrine: Negative.   Genitourinary: Negative.   Musculoskeletal: Negative.   Skin: Negative.   Allergic/Immunologic: Negative.   Neurological: Negative.   Hematological: Negative.   Psychiatric/Behavioral: Negative.       Objective:    Physical  Exam Vitals and nursing note reviewed.  Constitutional:      Appearance: Normal appearance.  HENT:     Head: Normocephalic and atraumatic.     Nose: Nose normal.     Mouth/Throat:     Mouth: Mucous membranes are moist.     Pharynx: Oropharynx is clear.  Cardiovascular:     Rate and Rhythm: Normal rate and regular rhythm.     Pulses: Normal pulses.     Heart sounds: Normal heart sounds.  Pulmonary:     Effort: Pulmonary effort is normal.     Breath sounds: Normal breath sounds.  Abdominal:     General: Bowel sounds are normal.     Palpations: Abdomen is soft.  Musculoskeletal:        General: Normal range of motion.     Cervical back: Normal range of motion and neck supple.  Skin:    General: Skin is warm and dry.  Neurological:     General: No focal deficit present.     Mental Status: She is alert and oriented to person, place, and time.  Psychiatric:        Mood and Affect: Mood normal.        Behavior: Behavior normal.        Thought Content: Thought content normal.        Judgment: Judgment normal.     BP 136/76 (BP Location: Left Arm, Patient Position: Sitting)   Pulse 90   Temp 98.9 F (37.2 C)   Ht 4' 11.06" (1.5 m)   Wt 145 lb 12.8 oz (66.1 kg)   LMP 12/24/2012   SpO2 100%   BMI 29.39 kg/m  Wt Readings from Last 3 Encounters:  03/25/20 145 lb 12.8 oz (66.1 kg)  08/05/19 144 lb 12.8 oz (65.7 kg)  02/04/19 148 lb (67.1 kg)     Health Maintenance Due  Topic Date Due  . Hepatitis C Screening  Never done  . COVID-19 Vaccine (1) Never done  . HIV Screening  Never done  . TETANUS/TDAP  Never done  . PAP SMEAR-Modifier  Never done  . COLONOSCOPY  Never done  . INFLUENZA VACCINE  Never done    There are no preventive care reminders to display for this patient.  Lab Results  Component Value Date   TSH 0.950 02/06/2019   Lab Results  Component Value Date   WBC 7.5 02/06/2019   HGB 11.9 02/06/2019   HCT 35.6 02/06/2019   MCV 82 02/06/2019   PLT  275 02/06/2019   Lab Results  Component Value Date   NA 138 02/06/2019   K 3.8 02/06/2019   CO2 21 02/06/2019   GLUCOSE 91 02/06/2019   BUN 13 02/06/2019   CREATININE  0.60 02/06/2019   BILITOT 0.4 02/06/2019   ALKPHOS 91 02/06/2019   AST 22 02/06/2019   ALT 16 02/06/2019   PROT 7.4 02/06/2019   ALBUMIN 4.4 02/06/2019   CALCIUM 9.5 02/06/2019   Lab Results  Component Value Date   CHOL 170 02/06/2019   Lab Results  Component Value Date   HDL 54 02/06/2019   Lab Results  Component Value Date   LDLCALC 96 02/06/2019   Lab Results  Component Value Date   TRIG 109 02/06/2019   Lab Results  Component Value Date   CHOLHDL 3.1 02/06/2019   Lab Results  Component Value Date   HGBA1C 5.6 08/05/2019    Assessment & Plan:   1. Encounter for annual physical exam  2. Vitamin D deficiency  3. Healthcare maintenance - CBC with Differential - Comprehensive metabolic panel - Lipid Panel - TSH - Vitamin B12 - Vitamin D, 25-hydroxy  4. Follow up She will follow up in 1 year.   No orders of the defined types were placed in this encounter.  Orders Placed This Encounter  Procedures  . CBC with Differential  . Comprehensive metabolic panel  . Lipid Panel  . TSH  . Vitamin B12  . Vitamin D, 25-hydroxy    Referral Orders  No referral(s) requested today    Raliegh Ip, MSN, ANE, FNP-BC [facility] [address] [place], [place] [address] [phone]- fax  Problem List Items Addressed This Visit      Other   Vitamin D deficiency    Other Visit Diagnoses    Encounter for annual physical exam    -  Primary   Healthcare maintenance       Relevant Orders   CBC with Differential   Comprehensive metabolic panel   Lipid Panel   TSH   Vitamin B12   Vitamin D, 25-hydroxy   Follow up          No orders of the defined types were placed in this  encounter.   Follow-up: No follow-ups on file.    Kallie Locks, FNP

## 2020-03-26 LAB — LIPID PANEL
Chol/HDL Ratio: 3.7 ratio (ref 0.0–4.4)
Cholesterol, Total: 195 mg/dL (ref 100–199)
HDL: 53 mg/dL (ref >39)
LDL Chol Calc (NIH): 123 mg/dL — ABNORMAL HIGH (ref 0–99)
Triglycerides: 106 mg/dL (ref 0–149)
VLDL Cholesterol Cal: 19 mg/dL (ref 5–40)

## 2020-03-26 LAB — COMPREHENSIVE METABOLIC PANEL
ALT: 16 IU/L (ref 0–32)
AST: 24 IU/L (ref 0–40)
Albumin/Globulin Ratio: 1.8 (ref 1.2–2.2)
Albumin: 4.6 g/dL (ref 3.8–4.9)
Alkaline Phosphatase: 100 IU/L (ref 44–121)
BUN/Creatinine Ratio: 19 (ref 9–23)
BUN: 12 mg/dL (ref 6–24)
Bilirubin Total: 0.3 mg/dL (ref 0.0–1.2)
CO2: 20 mmol/L (ref 20–29)
Calcium: 9.7 mg/dL (ref 8.7–10.2)
Chloride: 102 mmol/L (ref 96–106)
Creatinine, Ser: 0.64 mg/dL (ref 0.57–1.00)
GFR calc Af Amer: 119 mL/min/{1.73_m2} (ref >59)
GFR calc non Af Amer: 103 mL/min/{1.73_m2} (ref >59)
Globulin, Total: 2.6 g/dL (ref 1.5–4.5)
Glucose: 112 mg/dL — ABNORMAL HIGH (ref 65–99)
Potassium: 4 mmol/L (ref 3.5–5.2)
Sodium: 139 mmol/L (ref 134–144)
Total Protein: 7.2 g/dL (ref 6.0–8.5)

## 2020-03-26 LAB — VITAMIN D 25 HYDROXY (VIT D DEFICIENCY, FRACTURES): Vit D, 25-Hydroxy: 49.7 ng/mL (ref 30.0–100.0)

## 2020-03-26 LAB — CBC WITH DIFFERENTIAL/PLATELET
Basophils Absolute: 0 10*3/uL (ref 0.0–0.2)
Basos: 1 %
EOS (ABSOLUTE): 0.1 10*3/uL (ref 0.0–0.4)
Eos: 2 %
Hematocrit: 38.3 % (ref 34.0–46.6)
Hemoglobin: 13 g/dL (ref 11.1–15.9)
Immature Grans (Abs): 0 10*3/uL (ref 0.0–0.1)
Immature Granulocytes: 0 %
Lymphocytes Absolute: 2 10*3/uL (ref 0.7–3.1)
Lymphs: 33 %
MCH: 28.3 pg (ref 26.6–33.0)
MCHC: 33.9 g/dL (ref 31.5–35.7)
MCV: 83 fL (ref 79–97)
Monocytes Absolute: 0.4 10*3/uL (ref 0.1–0.9)
Monocytes: 7 %
Neutrophils Absolute: 3.3 10*3/uL (ref 1.4–7.0)
Neutrophils: 57 %
Platelets: 303 10*3/uL (ref 150–450)
RBC: 4.6 x10E6/uL (ref 3.77–5.28)
RDW: 13.6 % (ref 11.7–15.4)
WBC: 5.9 10*3/uL (ref 3.4–10.8)

## 2020-03-26 LAB — TSH: TSH: 1.16 u[IU]/mL (ref 0.450–4.500)

## 2020-03-26 LAB — VITAMIN B12: Vitamin B-12: 329 pg/mL (ref 232–1245)

## 2020-06-17 ENCOUNTER — Other Ambulatory Visit: Payer: Self-pay | Admitting: Family Medicine

## 2020-06-17 DIAGNOSIS — E559 Vitamin D deficiency, unspecified: Secondary | ICD-10-CM

## 2021-10-14 IMAGING — MG DIGITAL SCREENING BILAT W/ CAD
4 series · 4 of 4 positions shown · non-contrast
Comparison: None.

CLINICAL DATA: Screening.

EXAM:
DIGITAL SCREENING BILATERAL MAMMOGRAM WITH CAD

[R MLO]
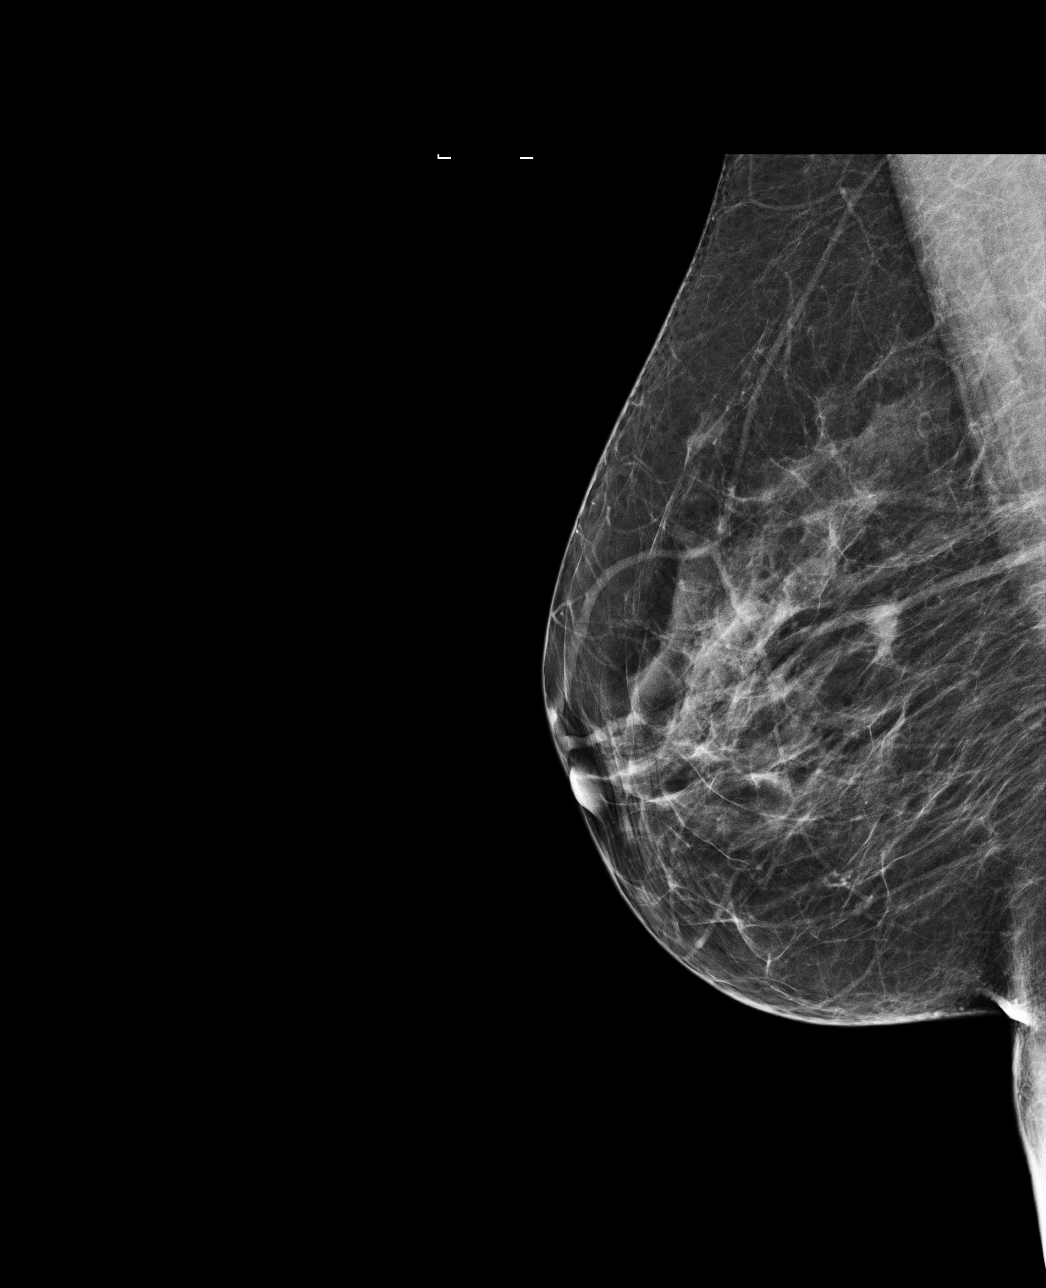

[L CC]
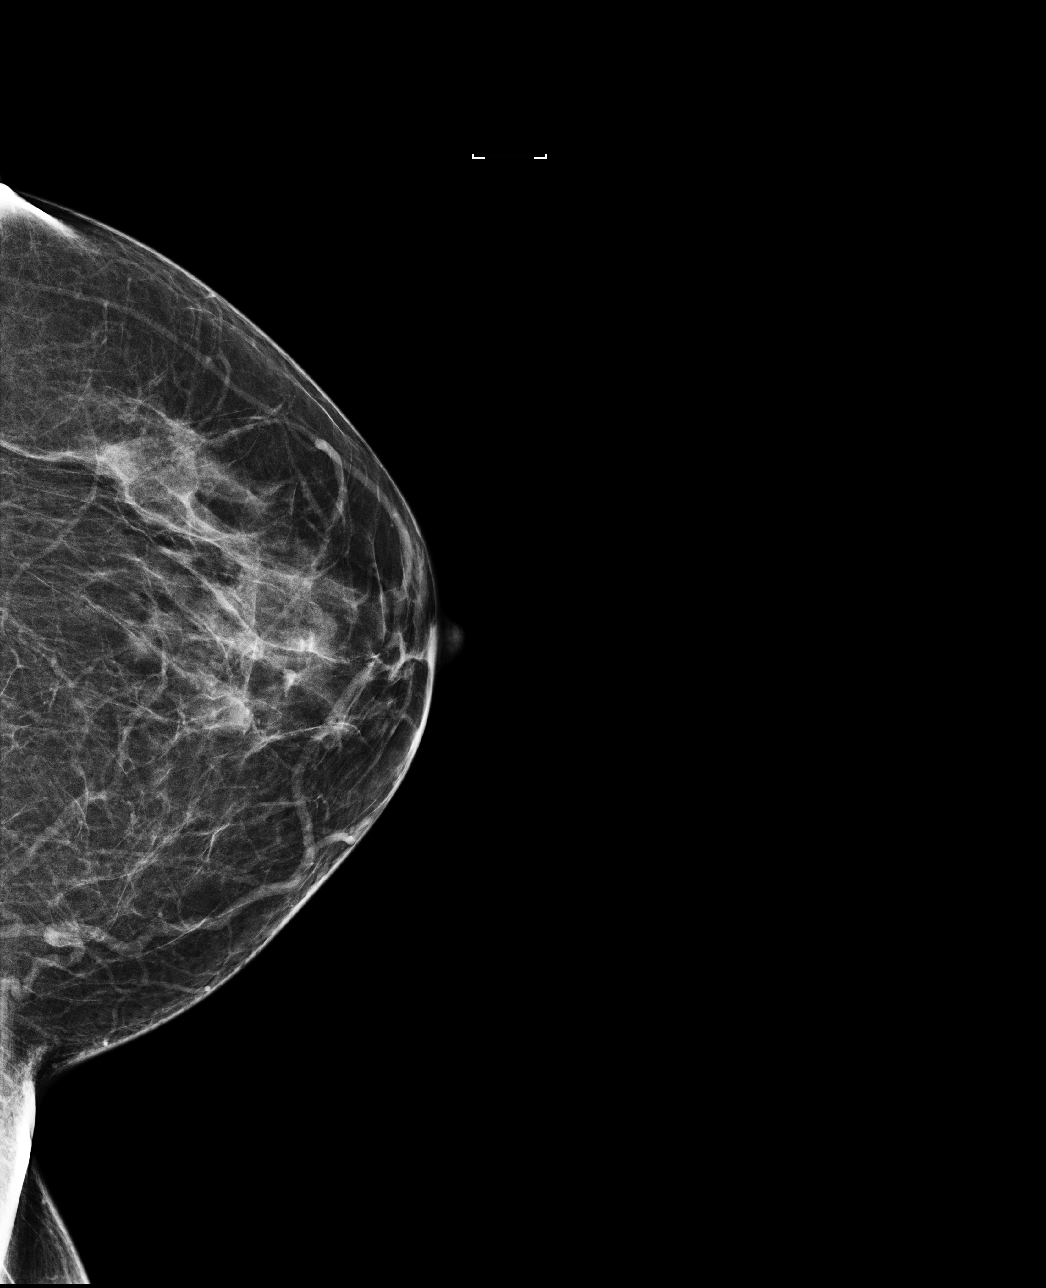

[R CC]
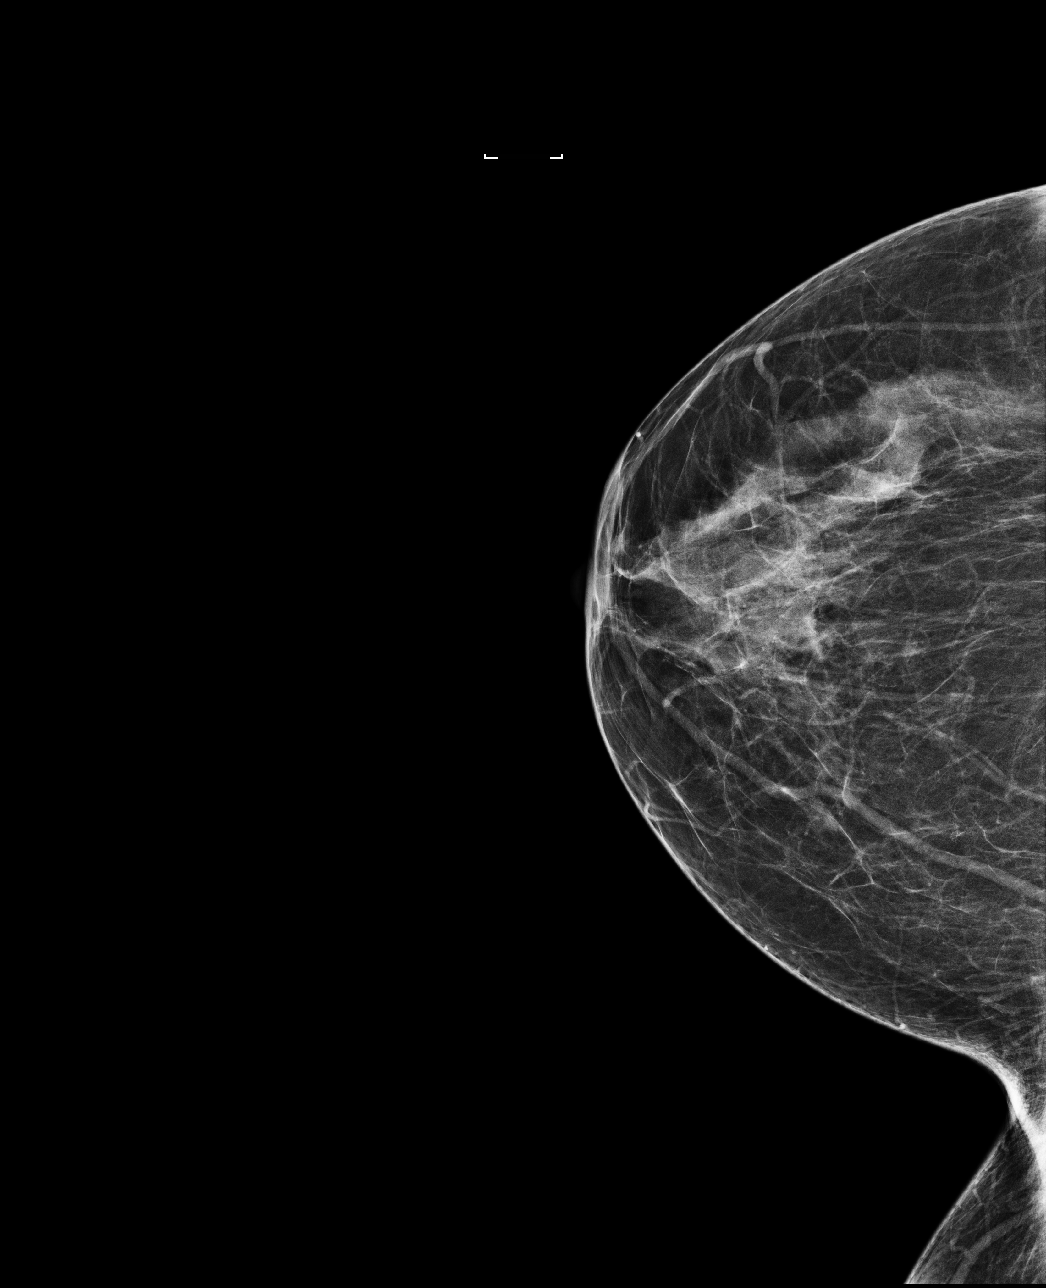

[L MLO]
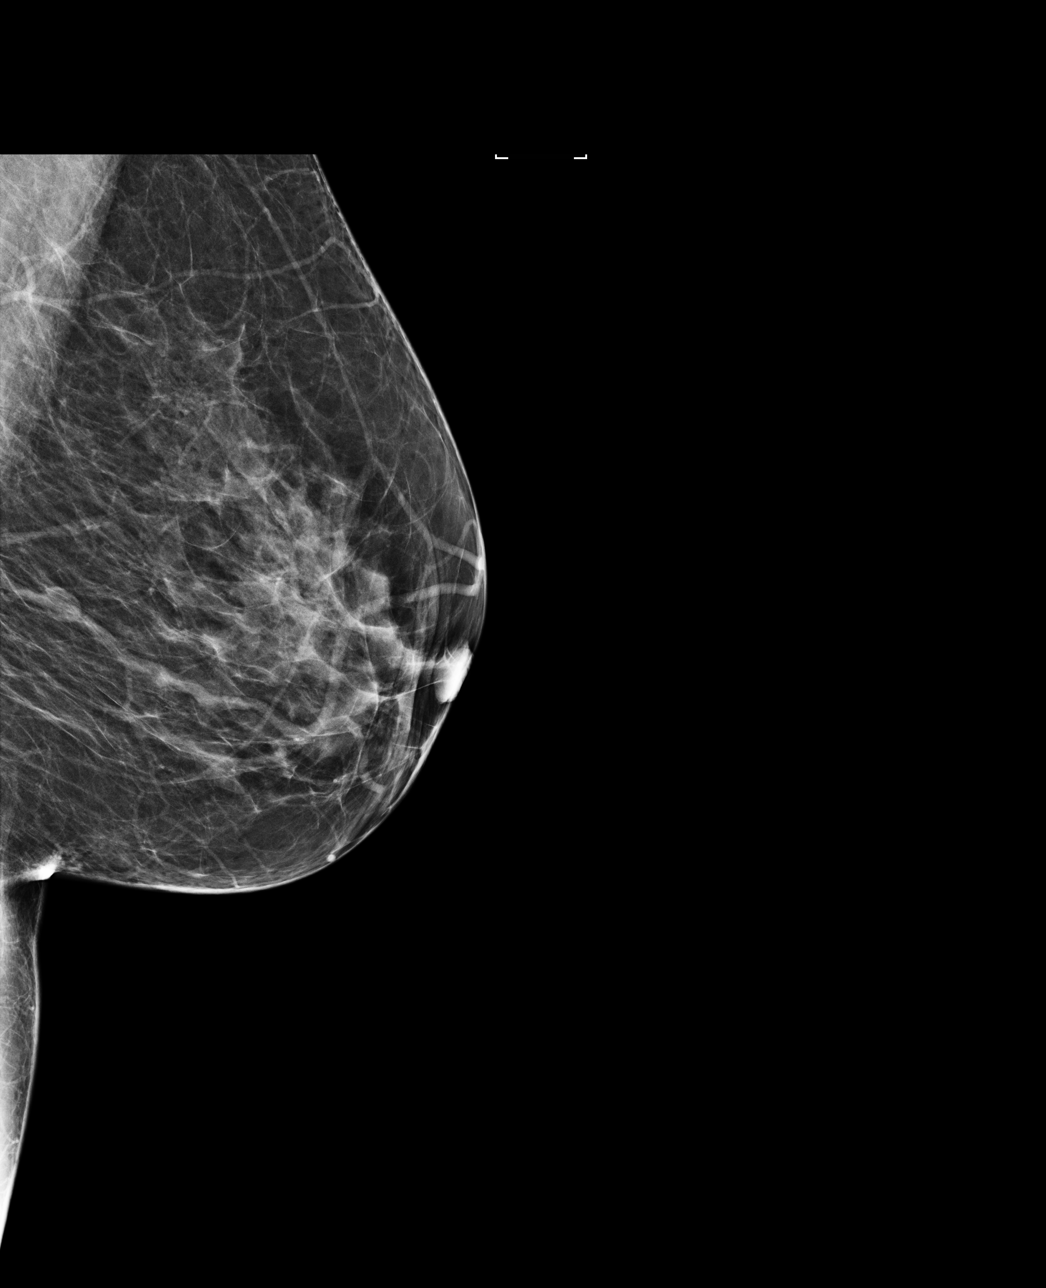

[4 of 4 positions shown; findings below may reference images not displayed]

Baseline.

ACR Breast Density Category c: The breast tissue is heterogeneously
dense, which may obscure small masses.
FINDINGS: In the right breast a possible asymmetry in the posterior mid breast
in the oblique projection requires further evaluation.

In the left breast a possible asymmetry in the slightly medial
aspect of the mid breast in the craniocaudal projection requires
further evaluation.

Images were processed with CAD.
IMPRESSION: Further evaluation is suggested for possible asymmetry in the right
breast.

Further evaluation is suggested for possible asymmetry in the left
breast.

RECOMMENDATION:
Diagnostic mammogram and possibly ultrasound of both breasts.
(Code:25-3-LLY)

The patient will be contacted regarding the findings, and additional
imaging will be scheduled.

BI-RADS CATEGORY  0: Incomplete. Need additional imaging evaluation
and/or prior mammograms for comparison.
